# Patient Record
Sex: Female | Born: 1970
Health system: Southern US, Community
[De-identification: ages and names within clinical notes are randomized; demographics above are authoritative.]

## PROBLEM LIST (undated history)

## (undated) ENCOUNTER — Ambulatory Visit: Payer: BC Managed Care – PPO | Source: Home / Self Care

## (undated) DIAGNOSIS — T7840XA Allergy, unspecified, initial encounter: Secondary | ICD-10-CM

## (undated) DIAGNOSIS — R519 Headache, unspecified: Secondary | ICD-10-CM

## (undated) DIAGNOSIS — K7689 Other specified diseases of liver: Secondary | ICD-10-CM

## (undated) DIAGNOSIS — K219 Gastro-esophageal reflux disease without esophagitis: Secondary | ICD-10-CM

## (undated) DIAGNOSIS — R112 Nausea with vomiting, unspecified: Secondary | ICD-10-CM

## (undated) DIAGNOSIS — E063 Autoimmune thyroiditis: Secondary | ICD-10-CM

## (undated) DIAGNOSIS — Z8669 Personal history of other diseases of the nervous system and sense organs: Secondary | ICD-10-CM

## (undated) DIAGNOSIS — E079 Disorder of thyroid, unspecified: Secondary | ICD-10-CM

## (undated) DIAGNOSIS — I10 Essential (primary) hypertension: Secondary | ICD-10-CM

## (undated) DIAGNOSIS — E039 Hypothyroidism, unspecified: Secondary | ICD-10-CM

## (undated) DIAGNOSIS — Z5189 Encounter for other specified aftercare: Secondary | ICD-10-CM

## (undated) DIAGNOSIS — Z9889 Other specified postprocedural states: Secondary | ICD-10-CM

## (undated) DIAGNOSIS — F419 Anxiety disorder, unspecified: Secondary | ICD-10-CM

## (undated) HISTORY — PX: ABDOMINAL HYSTERECTOMY: SHX81

## (undated) HISTORY — DX: Disorder of thyroid, unspecified: E07.9

## (undated) HISTORY — DX: Essential (primary) hypertension: I10

## (undated) HISTORY — PX: DILATION AND CURETTAGE OF UTERUS: SHX78

## (undated) HISTORY — DX: Anxiety disorder, unspecified: F41.9

## (undated) HISTORY — DX: Gastro-esophageal reflux disease without esophagitis: K21.9

## (undated) HISTORY — DX: Allergy, unspecified, initial encounter: T78.40XA

## (undated) HISTORY — PX: COLONOSCOPY: SHX174

## (undated) HISTORY — DX: Personal history of other diseases of the nervous system and sense organs: Z86.69

## (undated) HISTORY — DX: Encounter for other specified aftercare: Z51.89

## (undated) HISTORY — PX: WISDOM TOOTH EXTRACTION: SHX21

---

## 2005-06-17 ENCOUNTER — Emergency Department (HOSPITAL_COMMUNITY): Admission: EM | Admit: 2005-06-17 | Discharge: 2005-06-18 | Payer: Self-pay | Admitting: Emergency Medicine

## 2005-06-18 ENCOUNTER — Ambulatory Visit (HOSPITAL_COMMUNITY): Admission: RE | Admit: 2005-06-18 | Discharge: 2005-06-18 | Payer: Self-pay | Admitting: *Deleted

## 2005-06-29 ENCOUNTER — Emergency Department (HOSPITAL_COMMUNITY): Admission: EM | Admit: 2005-06-29 | Discharge: 2005-06-29 | Payer: Self-pay | Admitting: Emergency Medicine

## 2007-07-28 ENCOUNTER — Other Ambulatory Visit: Admission: RE | Admit: 2007-07-28 | Discharge: 2007-07-28 | Payer: Self-pay | Admitting: Obstetrics & Gynecology

## 2007-07-31 ENCOUNTER — Encounter: Admission: RE | Admit: 2007-07-31 | Discharge: 2007-07-31 | Payer: Self-pay | Admitting: Obstetrics & Gynecology

## 2010-09-22 ENCOUNTER — Encounter: Payer: Self-pay | Admitting: Internal Medicine

## 2010-09-24 ENCOUNTER — Encounter: Payer: Self-pay | Admitting: Endocrinology

## 2012-11-30 ENCOUNTER — Other Ambulatory Visit: Payer: Self-pay

## 2012-11-30 DIAGNOSIS — Z1231 Encounter for screening mammogram for malignant neoplasm of breast: Secondary | ICD-10-CM

## 2012-12-17 ENCOUNTER — Ambulatory Visit
Admission: RE | Admit: 2012-12-17 | Discharge: 2012-12-17 | Disposition: A | Discharge status: Home / Self Care | Payer: PRIVATE HEALTH INSURANCE | Source: Ambulatory Visit

## 2012-12-17 DIAGNOSIS — Z1231 Encounter for screening mammogram for malignant neoplasm of breast: Secondary | ICD-10-CM

## 2012-12-18 ENCOUNTER — Other Ambulatory Visit: Payer: Self-pay | Admitting: Specialist

## 2012-12-18 DIAGNOSIS — N63 Unspecified lump in unspecified breast: Secondary | ICD-10-CM

## 2013-01-04 ENCOUNTER — Ambulatory Visit
Admission: RE | Admit: 2013-01-04 | Discharge: 2013-01-04 | Disposition: A | Discharge status: Home / Self Care | Payer: PRIVATE HEALTH INSURANCE | Source: Ambulatory Visit | Attending: Specialist | Admitting: Specialist

## 2013-01-04 DIAGNOSIS — N63 Unspecified lump in unspecified breast: Secondary | ICD-10-CM

## 2013-10-15 ENCOUNTER — Other Ambulatory Visit: Payer: Self-pay | Admitting: Endocrinology

## 2013-10-15 DIAGNOSIS — E049 Nontoxic goiter, unspecified: Secondary | ICD-10-CM

## 2013-11-16 ENCOUNTER — Other Ambulatory Visit: Payer: PRIVATE HEALTH INSURANCE

## 2015-06-24 ENCOUNTER — Other Ambulatory Visit: Payer: Self-pay | Admitting: Endocrinology

## 2015-06-24 DIAGNOSIS — E049 Nontoxic goiter, unspecified: Secondary | ICD-10-CM

## 2015-06-28 ENCOUNTER — Ambulatory Visit
Admission: RE | Admit: 2015-06-28 | Discharge: 2015-06-28 | Disposition: A | Discharge status: Home / Self Care | Payer: BLUE CROSS/BLUE SHIELD | Source: Ambulatory Visit | Attending: Endocrinology | Admitting: Endocrinology

## 2015-06-28 DIAGNOSIS — E049 Nontoxic goiter, unspecified: Secondary | ICD-10-CM

## 2015-10-05 ENCOUNTER — Other Ambulatory Visit (INDEPENDENT_AMBULATORY_CARE_PROVIDER_SITE_OTHER): Payer: BLUE CROSS/BLUE SHIELD

## 2015-10-05 ENCOUNTER — Encounter: Payer: Self-pay | Admitting: Internal Medicine

## 2015-10-05 ENCOUNTER — Ambulatory Visit (INDEPENDENT_AMBULATORY_CARE_PROVIDER_SITE_OTHER): Payer: BLUE CROSS/BLUE SHIELD | Admitting: Internal Medicine

## 2015-10-05 VITALS — BP 118/60 | HR 67 | Temp 98.8°F | Resp 12 | Ht 63.0 in | Wt 109.4 lb

## 2015-10-05 DIAGNOSIS — E039 Hypothyroidism, unspecified: Secondary | ICD-10-CM | POA: Diagnosis not present

## 2015-10-05 DIAGNOSIS — Z23 Encounter for immunization: Secondary | ICD-10-CM | POA: Diagnosis not present

## 2015-10-05 DIAGNOSIS — Z0000 Encounter for general adult medical examination without abnormal findings: Secondary | ICD-10-CM

## 2015-10-05 LAB — CBC
HCT: 38.9 % (ref 36.0–46.0)
Hemoglobin: 12.8 g/dL (ref 12.0–15.0)
MCHC: 33 g/dL (ref 30.0–36.0)
MCV: 88.2 fl (ref 78.0–100.0)
Platelets: 211 10*3/uL (ref 150.0–400.0)
RBC: 4.41 Mil/uL (ref 3.87–5.11)
RDW: 13.3 % (ref 11.5–15.5)
WBC: 6.1 10*3/uL (ref 4.0–10.5)

## 2015-10-05 LAB — COMPREHENSIVE METABOLIC PANEL
ALT: 17 U/L (ref 0–35)
AST: 18 U/L (ref 0–37)
Albumin: 4.4 g/dL (ref 3.5–5.2)
Alkaline Phosphatase: 60 U/L (ref 39–117)
BUN: 11 mg/dL (ref 6–23)
CO2: 27 mEq/L (ref 19–32)
Calcium: 9.3 mg/dL (ref 8.4–10.5)
Chloride: 103 mEq/L (ref 96–112)
Creatinine, Ser: 0.62 mg/dL (ref 0.40–1.20)
GFR: 111.73 mL/min (ref >60.00)
Glucose, Bld: 84 mg/dL (ref 70–99)
Potassium: 3.8 mEq/L (ref 3.5–5.1)
Sodium: 137 mEq/L (ref 135–145)
Total Bilirubin: 0.5 mg/dL (ref 0.2–1.2)
Total Protein: 7.5 g/dL (ref 6.0–8.3)

## 2015-10-05 LAB — LIPID PANEL
Cholesterol: 153 mg/dL (ref 0–200)
HDL: 48 mg/dL (ref >39.00)
LDL Cholesterol: 89 mg/dL (ref 0–99)
NonHDL: 104.97
Total CHOL/HDL Ratio: 3
Triglycerides: 79 mg/dL (ref 0.0–149.0)
VLDL: 15.8 mg/dL (ref 0.0–40.0)

## 2015-10-05 NOTE — Progress Notes (Signed)
Pre visit review using our clinic review tool, if applicable. No additional management support is needed unless otherwise documented below in the visit note. 

## 2015-10-05 NOTE — Patient Instructions (Signed)
We are checking the blood work today and will call you back with the results.   We have given you the flu shot and the tetanus shot today.   Health Maintenance, Female Adopting a healthy lifestyle and getting preventive care can go a long way to promote health and wellness. Talk with your health care provider about what schedule of regular examinations is right for you. This is a good chance for you to check in with your provider about disease prevention and staying healthy. In between checkups, there are plenty of things you can do on your own. Experts have done a lot of research about which lifestyle changes and preventive measures are most likely to keep you healthy. Ask your health care provider for more information. WEIGHT AND DIET  Eat a healthy diet  Be sure to include plenty of vegetables, fruits, low-fat dairy products, and lean protein.  Do not eat a lot of foods high in solid fats, added sugars, or salt.  Get regular exercise. This is one of the most important things you can do for your health.  Most adults should exercise for at least 150 minutes each week. The exercise should increase your heart rate and make you sweat (moderate-intensity exercise).  Most adults should also do strengthening exercises at least twice a week. This is in addition to the moderate-intensity exercise.  Maintain a healthy weight  Body mass index (BMI) is a measurement that can be used to identify possible weight problems. It estimates body fat based on height and weight. Your health care provider can help determine your BMI and help you achieve or maintain a healthy weight.  For females 67 years of age and older:   A BMI below 18.5 is considered underweight.  A BMI of 18.5 to 24.9 is normal.  A BMI of 25 to 29.9 is considered overweight.  A BMI of 30 and above is considered obese.  Watch levels of cholesterol and blood lipids  You should start having your blood tested for lipids and  cholesterol at 43 years of age, then have this test every 5 years.  You may need to have your cholesterol levels checked more often if:  Your lipid or cholesterol levels are high.  You are older than 43 years of age.  You are at high risk for heart disease.  CANCER SCREENING   Lung Cancer  Lung cancer screening is recommended for adults 47-41 years old who are at high risk for lung cancer because of a history of smoking.  A yearly low-dose CT scan of the lungs is recommended for people who:  Currently smoke.  Have quit within the past 15 years.  Have at least a 30-pack-year history of smoking. A pack year is smoking an average of one pack of cigarettes a day for 1 year.  Yearly screening should continue until it has been 15 years since you quit.  Yearly screening should stop if you develop a health problem that would prevent you from having lung cancer treatment.  Breast Cancer  Practice breast self-awareness. This means understanding how your breasts normally appear and feel.  It also means doing regular breast self-exams. Let your health care provider know about any changes, no matter how small.  If you are in your 20s or 30s, you should have a clinical breast exam (CBE) by a health care provider every 1-3 years as part of a regular health exam.  If you are 31 or older, have a CBE every year.  Also consider having a breast X-ray (mammogram) every year.  If you have a family history of breast cancer, talk to your health care provider about genetic screening.  If you are at high risk for breast cancer, talk to your health care provider about having an MRI and a mammogram every year.  Breast cancer gene (BRCA) assessment is recommended for women who have family members with BRCA-related cancers. BRCA-related cancers include:  Breast.  Ovarian.  Tubal.  Peritoneal cancers.  Results of the assessment will determine the need for genetic counseling and BRCA1 and BRCA2  testing. Cervical Cancer Your health care provider may recommend that you be screened regularly for cancer of the pelvic organs (ovaries, uterus, and vagina). This screening involves a pelvic examination, including checking for microscopic changes to the surface of your cervix (Pap test). You may be encouraged to have this screening done every 3 years, beginning at age 21.  For women ages 30-65, health care providers may recommend pelvic exams and Pap testing every 3 years, or they may recommend the Pap and pelvic exam, combined with testing for human papilloma virus (HPV), every 5 years. Some types of HPV increase your risk of cervical cancer. Testing for HPV may also be done on women of any age with unclear Pap test results.  Other health care providers may not recommend any screening for nonpregnant women who are considered low risk for pelvic cancer and who do not have symptoms. Ask your health care provider if a screening pelvic exam is right for you.  If you have had past treatment for cervical cancer or a condition that could lead to cancer, you need Pap tests and screening for cancer for at least 20 years after your treatment. If Pap tests have been discontinued, your risk factors (such as having a new sexual partner) need to be reassessed to determine if screening should resume. Some women have medical problems that increase the chance of getting cervical cancer. In these cases, your health care provider may recommend more frequent screening and Pap tests. Colorectal Cancer  This type of cancer can be detected and often prevented.  Routine colorectal cancer screening usually begins at 43 years of age and continues through 43 years of age.  Your health care provider may recommend screening at an earlier age if you have risk factors for colon cancer.  Your health care provider may also recommend using home test kits to check for hidden blood in the stool.  A small camera at the end of a  tube can be used to examine your colon directly (sigmoidoscopy or colonoscopy). This is done to check for the earliest forms of colorectal cancer.  Routine screening usually begins at age 50.  Direct examination of the colon should be repeated every 5-10 years through 43 years of age. However, you may need to be screened more often if early forms of precancerous polyps or small growths are found. Skin Cancer  Check your skin from head to toe regularly.  Tell your health care provider about any new moles or changes in moles, especially if there is a change in a mole's shape or color.  Also tell your health care provider if you have a mole that is larger than the size of a pencil eraser.  Always use sunscreen. Apply sunscreen liberally and repeatedly throughout the day.  Protect yourself by wearing long sleeves, pants, a wide-brimmed hat, and sunglasses whenever you are outside. HEART DISEASE, DIABETES, AND HIGH BLOOD PRESSURE     High blood pressure causes heart disease and increases the risk of stroke. High blood pressure is more likely to develop in:  People who have blood pressure in the high end of the normal range (130-139/85-89 mm Hg).  People who are overweight or obese.  People who are African American.  If you are 18-39 years of age, have your blood pressure checked every 3-5 years. If you are 40 years of age or older, have your blood pressure checked every year. You should have your blood pressure measured twice--once when you are at a hospital or clinic, and once when you are not at a hospital or clinic. Record the average of the two measurements. To check your blood pressure when you are not at a hospital or clinic, you can use:  An automated blood pressure machine at a pharmacy.  A home blood pressure monitor.  If you are between 55 years and 79 years old, ask your health care provider if you should take aspirin to prevent strokes.  Have regular diabetes screenings. This  involves taking a blood sample to check your fasting blood sugar level.  If you are at a normal weight and have a low risk for diabetes, have this test once every three years after 43 years of age.  If you are overweight and have a high risk for diabetes, consider being tested at a younger age or more often. PREVENTING INFECTION  Hepatitis B  If you have a higher risk for hepatitis B, you should be screened for this virus. You are considered at high risk for hepatitis B if:  You were born in a country where hepatitis B is common. Ask your health care provider which countries are considered high risk.  Your parents were born in a high-risk country, and you have not been immunized against hepatitis B (hepatitis B vaccine).  You have HIV or AIDS.  You use needles to inject street drugs.  You live with someone who has hepatitis B.  You have had sex with someone who has hepatitis B.  You get hemodialysis treatment.  You take certain medicines for conditions, including cancer, organ transplantation, and autoimmune conditions. Hepatitis C  Blood testing is recommended for:  Everyone born from 1945 through 1965.  Anyone with known risk factors for hepatitis C. Sexually transmitted infections (STIs)  You should be screened for sexually transmitted infections (STIs) including gonorrhea and chlamydia if:  You are sexually active and are younger than 43 years of age.  You are older than 43 years of age and your health care provider tells you that you are at risk for this type of infection.  Your sexual activity has changed since you were last screened and you are at an increased risk for chlamydia or gonorrhea. Ask your health care provider if you are at risk.  If you do not have HIV, but are at risk, it may be recommended that you take a prescription medicine daily to prevent HIV infection. This is called pre-exposure prophylaxis (PrEP). You are considered at risk if:  You are  sexually active and do not regularly use condoms or know the HIV status of your partner(s).  You take drugs by injection.  You are sexually active with a partner who has HIV. Talk with your health care provider about whether you are at high risk of being infected with HIV. If you choose to begin PrEP, you should first be tested for HIV. You should then be tested every 3 months for as   long as you are taking PrEP.  PREGNANCY   If you are premenopausal and you may become pregnant, ask your health care provider about preconception counseling.  If you may become pregnant, take 400 to 800 micrograms (mcg) of folic acid every day.  If you want to prevent pregnancy, talk to your health care provider about birth control (contraception). OSTEOPOROSIS AND MENOPAUSE   Osteoporosis is a disease in which the bones lose minerals and strength with aging. This can result in serious bone fractures. Your risk for osteoporosis can be identified using a bone density scan.  If you are 27 years of age or older, or if you are at risk for osteoporosis and fractures, ask your health care provider if you should be screened.  Ask your health care provider whether you should take a calcium or vitamin D supplement to lower your risk for osteoporosis.  Menopause may have certain physical symptoms and risks.  Hormone replacement therapy may reduce some of these symptoms and risks. Talk to your health care provider about whether hormone replacement therapy is right for you.  HOME CARE INSTRUCTIONS   Schedule regular health, dental, and eye exams.  Stay current with your immunizations.   Do not use any tobacco products including cigarettes, chewing tobacco, or electronic cigarettes.  If you are pregnant, do not drink alcohol.  If you are breastfeeding, limit how much and how often you drink alcohol.  Limit alcohol intake to no more than 1 drink per day for nonpregnant women. One drink equals 12 ounces of beer, 5  ounces of wine, or 1 ounces of hard liquor.  Do not use street drugs.  Do not share needles.  Ask your health care provider for help if you need support or information about quitting drugs.  Tell your health care provider if you often feel depressed.  Tell your health care provider if you have ever been abused or do not feel safe at home.   This information is not intended to replace advice given to you by your health care provider. Make sure you discuss any questions you have with your health care provider.   Document Released: 03/04/2011 Document Revised: 09/09/2014 Document Reviewed: 07/21/2013 Elsevier Interactive Patient Education [employer].

## 2015-10-06 DIAGNOSIS — Z0000 Encounter for general adult medical examination without abnormal findings: Secondary | ICD-10-CM | POA: Insufficient documentation

## 2015-10-06 DIAGNOSIS — E039 Hypothyroidism, unspecified: Secondary | ICD-10-CM | POA: Insufficient documentation

## 2015-10-06 NOTE — Assessment & Plan Note (Signed)
Given flu and tetanus and she declines HIV screening today. Non-smoker and advised on exercise. Counseled on sun protection and mole surveillance. Given screening guidelines.

## 2015-10-06 NOTE — Progress Notes (Signed)
   Subjective:    Patient ID: Dana Pace, female    DOB: 09/10/1970, 45 y.o.   MRN: [id number]  HPI The patient is a 45 YO female coming in for wellness as a new patient. No concerns or complaints. Takes thyroid medicine which she is currently getting from endo and levels recently checked and normal.   PMH, [facility], social history reviewed and updated.   Review of Systems  Constitutional: Negative for fever, activity change, appetite change, fatigue and unexpected weight change.  HENT: Negative.   Eyes: Negative.   Respiratory: Negative for cough, chest tightness, shortness of breath and wheezing.   Cardiovascular: Negative for chest pain, palpitations and leg swelling.  Gastrointestinal: Negative for nausea, abdominal pain, diarrhea, constipation and abdominal distention.  Musculoskeletal: Negative.   Skin: Negative.   Neurological: Negative.   Psychiatric/Behavioral: Negative.       Objective:   Physical Exam  Constitutional: She is oriented to person, place, and time. She appears well-developed and well-nourished.  HENT:  Head: Normocephalic and atraumatic.  Eyes: EOM are normal.  Neck: Normal range of motion.  Cardiovascular: Normal rate and regular rhythm.   Pulmonary/Chest: Effort normal and breath sounds normal. No respiratory distress. She has no wheezes.  Abdominal: Soft. Bowel sounds are normal. She exhibits no distension. There is no tenderness. There is no rebound.  Musculoskeletal: She exhibits no edema.  Neurological: She is alert and oriented to person, place, and time. Coordination normal.  Skin: Skin is warm and dry.  Psychiatric: She has a normal mood and affect.   Filed Vitals:   10/05/15 1039  BP: 118/60  Pulse: 67  Temp: 98.8 F (37.1 C)  TempSrc: Oral  Resp: 12  Height: 5\' 3"  (1.6 m)  Weight: 109 lb 6.4 oz (49.624 kg)  SpO2: 98%      Assessment & Plan:  Tdap and flu shot given at visit.

## 2015-10-06 NOTE — Assessment & Plan Note (Signed)
Currently taking 125 mcg daily synthroid and levels recently checked.

## 2016-09-09 DIAGNOSIS — E039 Hypothyroidism, unspecified: Secondary | ICD-10-CM | POA: Diagnosis not present

## 2016-09-11 DIAGNOSIS — E039 Hypothyroidism, unspecified: Secondary | ICD-10-CM | POA: Diagnosis not present

## 2016-09-11 DIAGNOSIS — E049 Nontoxic goiter, unspecified: Secondary | ICD-10-CM | POA: Diagnosis not present

## 2016-12-30 ENCOUNTER — Encounter: Payer: BLUE CROSS/BLUE SHIELD | Admitting: Internal Medicine

## 2017-01-03 ENCOUNTER — Ambulatory Visit (INDEPENDENT_AMBULATORY_CARE_PROVIDER_SITE_OTHER): Payer: BLUE CROSS/BLUE SHIELD | Admitting: Internal Medicine

## 2017-01-03 ENCOUNTER — Encounter: Payer: Self-pay | Admitting: Internal Medicine

## 2017-01-03 ENCOUNTER — Other Ambulatory Visit (INDEPENDENT_AMBULATORY_CARE_PROVIDER_SITE_OTHER): Payer: BLUE CROSS/BLUE SHIELD

## 2017-01-03 VITALS — BP 130/74 | HR 66 | Temp 98.8°F | Resp 12 | Ht 63.0 in | Wt 108.5 lb

## 2017-01-03 DIAGNOSIS — Z0000 Encounter for general adult medical examination without abnormal findings: Secondary | ICD-10-CM

## 2017-01-03 DIAGNOSIS — E039 Hypothyroidism, unspecified: Secondary | ICD-10-CM | POA: Diagnosis not present

## 2017-01-03 LAB — CBC
HCT: 37 % (ref 36.0–46.0)
Hemoglobin: 12.4 g/dL (ref 12.0–15.0)
MCHC: 33.6 g/dL (ref 30.0–36.0)
MCV: 89 fl (ref 78.0–100.0)
Platelets: 212 10*3/uL (ref 150.0–400.0)
RBC: 4.16 Mil/uL (ref 3.87–5.11)
RDW: 13.2 % (ref 11.5–15.5)
WBC: 6.7 10*3/uL (ref 4.0–10.5)

## 2017-01-03 LAB — COMPREHENSIVE METABOLIC PANEL
ALT: 12 U/L (ref 0–35)
AST: 15 U/L (ref 0–37)
Albumin: 4.4 g/dL (ref 3.5–5.2)
Alkaline Phosphatase: 57 U/L (ref 39–117)
BUN: 10 mg/dL (ref 6–23)
CO2: 27 mEq/L (ref 19–32)
Calcium: 9 mg/dL (ref 8.4–10.5)
Chloride: 109 mEq/L (ref 96–112)
Creatinine, Ser: 0.69 mg/dL (ref 0.40–1.20)
GFR: 98.18 mL/min (ref >60.00)
Glucose, Bld: 90 mg/dL (ref 70–99)
Potassium: 3.5 mEq/L (ref 3.5–5.1)
Sodium: 143 mEq/L (ref 135–145)
Total Bilirubin: 0.5 mg/dL (ref 0.2–1.2)
Total Protein: 6.9 g/dL (ref 6.0–8.3)

## 2017-01-03 LAB — LIPID PANEL
Cholesterol: 151 mg/dL (ref 0–200)
HDL: 48.6 mg/dL (ref >39.00)
LDL Cholesterol: 87 mg/dL (ref 0–99)
NonHDL: 102.73
Total CHOL/HDL Ratio: 3
Triglycerides: 79 mg/dL (ref 0.0–149.0)
VLDL: 15.8 mg/dL (ref 0.0–40.0)

## 2017-01-03 LAB — T4, FREE: Free T4: 1.11 ng/dL (ref 0.60–1.60)

## 2017-01-03 LAB — TSH: TSH: 8.07 u[IU]/mL — ABNORMAL HIGH (ref 0.35–4.50)

## 2017-01-03 LAB — VITAMIN D 25 HYDROXY (VIT D DEFICIENCY, FRACTURES): VITD: 26.56 ng/mL — ABNORMAL LOW (ref 30.00–100.00)

## 2017-01-03 MED ORDER — TERBINAFINE HCL 250 MG PO TABS: 250.0000 mg | ORAL_TABLET | Freq: Every day | ORAL | 0 refills | Status: DC

## 2017-01-03 NOTE — Progress Notes (Signed)
Pre visit review using our clinic review tool, if applicable. No additional management support is needed unless otherwise documented below in the visit note. 

## 2017-01-03 NOTE — Patient Instructions (Signed)
We have sent in the lamisil to try for the fungus. Take 1 pill daily for the one week then skip 3 weeks, then take for 1 week, then skip 3 weeks, then take for 1 week then stop. This has about a 30% chance of clearing the fungus.   Health Maintenance, Female Adopting a healthy lifestyle and getting preventive care can go a long way to promote health and wellness. Talk with your health care provider about what schedule of regular examinations is right for you. This is a good chance for you to check in with your provider about disease prevention and staying healthy. In between checkups, there are plenty of things you can do on your own. Experts have done a lot of research about which lifestyle changes and preventive measures are most likely to keep you healthy. Ask your health care provider for more information. Weight and diet Eat a healthy diet  Be sure to include plenty of vegetables, fruits, low-fat dairy products, and lean protein.  Do not eat a lot of foods high in solid fats, added sugars, or salt.  Get regular exercise. This is one of the most important things you can do for your health.  Most adults should exercise for at least 150 minutes each week. The exercise should increase your heart rate and make you sweat (moderate-intensity exercise).  Most adults should also do strengthening exercises at least twice a week. This is in addition to the moderate-intensity exercise. Maintain a healthy weight  Body mass index (BMI) is a measurement that can be used to identify possible weight problems. It estimates body fat based on height and weight. Your health care provider can help determine your BMI and help you achieve or maintain a healthy weight.  For females 78 years of age and older:  A BMI below 18.5 is considered underweight.  A BMI of 18.5 to 24.9 is normal.  A BMI of 25 to 29.9 is considered overweight.  A BMI of 30 and above is considered obese. Watch levels of cholesterol and  blood lipids  You should start having your blood tested for lipids and cholesterol at 44 years of age, then have this test every 5 years.  You may need to have your cholesterol levels checked more often if:  Your lipid or cholesterol levels are high.  You are older than 44 years of age.  You are at high risk for heart disease. Cancer screening Lung Cancer  Lung cancer screening is recommended for adults 2-25 years old who are at high risk for lung cancer because of a history of smoking.  A yearly low-dose CT scan of the lungs is recommended for people who:  Currently smoke.  Have quit within the past 15 years.  Have at least a 30-pack-year history of smoking. A pack year is smoking an average of one pack of cigarettes a day for 1 year.  Yearly screening should continue until it has been 15 years since you quit.  Yearly screening should stop if you develop a health problem that would prevent you from having lung cancer treatment. Breast Cancer  Practice breast self-awareness. This means understanding how your breasts normally appear and feel.  It also means doing regular breast self-exams. Let your health care provider know about any changes, no matter how small.  If you are in your 20s or 30s, you should have a clinical breast exam (CBE) by a health care provider every 1-3 years as part of a regular health exam.  If you are 40 or older, have a CBE every year. Also consider having a breast X-ray (mammogram) every year.  If you have a family history of breast cancer, talk to your health care provider about genetic screening.  If you are at high risk for breast cancer, talk to your health care provider about having an MRI and a mammogram every year.  Breast cancer gene (BRCA) assessment is recommended for women who have family members with BRCA-related cancers. BRCA-related cancers include:  Breast.  Ovarian.  Tubal.  Peritoneal cancers.  Results of the assessment  will determine the need for genetic counseling and BRCA1 and BRCA2 testing. Cervical Cancer  Your health care provider may recommend that you be screened regularly for cancer of the pelvic organs (ovaries, uterus, and vagina). This screening involves a pelvic examination, including checking for microscopic changes to the surface of your cervix (Pap test). You may be encouraged to have this screening done every 3 years, beginning at age 65.  For women ages 21-65, health care providers may recommend pelvic exams and Pap testing every 3 years, or they may recommend the Pap and pelvic exam, combined with testing for human papilloma virus (HPV), every 5 years. Some types of HPV increase your risk of cervical cancer. Testing for HPV may also be done on women of any age with unclear Pap test results.  Other health care providers may not recommend any screening for nonpregnant women who are considered low risk for pelvic cancer and who do not have symptoms. Ask your health care provider if a screening pelvic exam is right for you.  If you have had past treatment for cervical cancer or a condition that could lead to cancer, you need Pap tests and screening for cancer for at least 20 years after your treatment. If Pap tests have been discontinued, your risk factors (such as having a new sexual partner) need to be reassessed to determine if screening should resume. Some women have medical problems that increase the chance of getting cervical cancer. In these cases, your health care provider may recommend more frequent screening and Pap tests. Colorectal Cancer  This type of cancer can be detected and often prevented.  Routine colorectal cancer screening usually begins at 45 years of age and continues through 44 years of age.  Your health care provider may recommend screening at an earlier age if you have risk factors for colon cancer.  Your health care provider may also recommend using home test kits to check  for hidden blood in the stool.  A small camera at the end of a tube can be used to examine your colon directly (sigmoidoscopy or colonoscopy). This is done to check for the earliest forms of colorectal cancer.  Routine screening usually begins at age 28.  Direct examination of the colon should be repeated every 5-10 years through 44 years of age. However, you may need to be screened more often if early forms of precancerous polyps or small growths are found. Skin Cancer  Check your skin from head to toe regularly.  Tell your health care provider about any new moles or changes in moles, especially if there is a change in a mole's shape or color.  Also tell your health care provider if you have a mole that is larger than the size of a pencil eraser.  Always use sunscreen. Apply sunscreen liberally and repeatedly throughout the day.  Protect yourself by wearing long sleeves, pants, a wide-brimmed hat, and sunglasses whenever  you are outside. Heart disease, diabetes, and high blood pressure  High blood pressure causes heart disease and increases the risk of stroke. High blood pressure is more likely to develop in:  People who have blood pressure in the high end of the normal range (130-139/85-89 mm Hg).  People who are overweight or obese.  People who are African American.  If you are 65-49 years of age, have your blood pressure checked every 3-5 years. If you are 70 years of age or older, have your blood pressure checked every year. You should have your blood pressure measured twice-once when you are at a hospital or clinic, and once when you are not at a hospital or clinic. Record the average of the two measurements. To check your blood pressure when you are not at a hospital or clinic, you can use:  An automated blood pressure machine at a pharmacy.  A home blood pressure monitor.  If you are between 17 years and 76 years old, ask your health care provider if you should take aspirin  to prevent strokes.  Have regular diabetes screenings. This involves taking a blood sample to check your fasting blood sugar level.  If you are at a normal weight and have a low risk for diabetes, have this test once every three years after 44 years of age.  If you are overweight and have a high risk for diabetes, consider being tested at a younger age or more often. Preventing infection Hepatitis B  If you have a higher risk for hepatitis B, you should be screened for this virus. You are considered at high risk for hepatitis B if:  You were born in a country where hepatitis B is common. Ask your health care provider which countries are considered high risk.  Your parents were born in a high-risk country, and you have not been immunized against hepatitis B (hepatitis B vaccine).  You have HIV or AIDS.  You use needles to inject street drugs.  You live with someone who has hepatitis B.  You have had sex with someone who has hepatitis B.  You get hemodialysis treatment.  You take certain medicines for conditions, including cancer, organ transplantation, and autoimmune conditions. Hepatitis C  Blood testing is recommended for:  Everyone born from [birth year] through 1963.  Anyone with known risk factors for hepatitis C. Sexually transmitted infections (STIs)  You should be screened for sexually transmitted infections (STIs) including gonorrhea and chlamydia if:  You are sexually active and are younger than 44 years of age.  You are older than 44 years of age and your health care provider tells you that you are at risk for this type of infection.  Your sexual activity has changed since you were last screened and you are at an increased risk for chlamydia or gonorrhea. Ask your health care provider if you are at risk.  If you do not have HIV, but are at risk, it may be recommended that you take a prescription medicine daily to prevent HIV infection. This is called pre-exposure  prophylaxis (PrEP). You are considered at risk if:  You are sexually active and do not regularly use condoms or know the HIV status of your partner(s).  You take drugs by injection.  You are sexually active with a partner who has HIV. Talk with your health care provider about whether you are at high risk of being infected with HIV. If you choose to begin PrEP, you should first be tested for HIV.  You should then be tested every 3 months for as long as you are taking PrEP. Pregnancy  If you are premenopausal and you may become pregnant, ask your health care provider about preconception counseling.  If you may become pregnant, take 400 to 800 micrograms (mcg) of folic acid every day.  If you want to prevent pregnancy, talk to your health care provider about birth control (contraception). Osteoporosis and menopause  Osteoporosis is a disease in which the bones lose minerals and strength with aging. This can result in serious bone fractures. Your risk for osteoporosis can be identified using a bone density scan.  If you are 78 years of age or older, or if you are at risk for osteoporosis and fractures, ask your health care provider if you should be screened.  Ask your health care provider whether you should take a calcium or vitamin D supplement to lower your risk for osteoporosis.  Menopause may have certain physical symptoms and risks.  Hormone replacement therapy may reduce some of these symptoms and risks. Talk to your health care provider about whether hormone replacement therapy is right for you. Follow these instructions at home:  Schedule regular health, dental, and eye exams.  Stay current with your immunizations.  Do not use any tobacco products including cigarettes, chewing tobacco, or electronic cigarettes.  If you are pregnant, do not drink alcohol.  If you are breastfeeding, limit how much and how often you drink alcohol.  Limit alcohol intake to no more than 1 drink  per day for nonpregnant women. One drink equals 12 ounces of beer, 5 ounces of wine, or 1 ounces of hard liquor.  Do not use street drugs.  Do not share needles.  Ask your health care provider for help if you need support or information about quitting drugs.  Tell your health care provider if you often feel depressed.  Tell your health care provider if you have ever been abused or do not feel safe at home. This information is not intended to replace advice given to you by your health care provider. Make sure you discuss any questions you have with your health care provider. Document Released: 03/04/2011 Document Revised: 01/25/2016 Document Reviewed: 05/23/2015 Elsevier Interactive Patient Education  2017 [employer].

## 2017-01-03 NOTE — Assessment & Plan Note (Addendum)
HIV screening at her work, flu and tetanus up to date. Counseled on sun safety and dangers of distracted driving. Colonoscopy due at 55. Given screening recommendations.

## 2017-01-03 NOTE — Progress Notes (Signed)
   Subjective:    Patient ID: Dana Pace, female    DOB: 12/20/1970, 46 y.o.   MRN: [id number]  HPI The patient is a 46 YO female coming in for wellness.   PMH, [facility], social history reviewed and updated.   Review of Systems  Constitutional: Negative.   HENT: Negative.   Eyes: Negative.   Respiratory: Negative for cough, chest tightness and shortness of breath.   Cardiovascular: Negative for chest pain, palpitations and leg swelling.  Gastrointestinal: Negative for abdominal distention, abdominal pain, constipation, diarrhea, nausea and vomiting.  Musculoskeletal: Negative.   Skin: Negative.   Neurological: Negative.   Psychiatric/Behavioral: Negative.       Objective:   Physical Exam  Constitutional: She is oriented to person, place, and time. She appears well-developed and well-nourished.  HENT:  Head: Normocephalic and atraumatic.  Eyes: EOM are normal.  Neck: Normal range of motion.  Cardiovascular: Normal rate and regular rhythm.   Pulmonary/Chest: Effort normal and breath sounds normal. No respiratory distress. She has no wheezes. She has no rales.  Abdominal: Soft. Bowel sounds are normal. She exhibits no distension. There is no tenderness. There is no rebound.  Musculoskeletal: She exhibits no edema.  Neurological: She is alert and oriented to person, place, and time. Coordination normal.  Skin: Skin is warm and dry.  Psychiatric: She has a normal mood and affect.   Vitals:   01/03/17 1257  BP: 130/74  Pulse: 66  Resp: 12  Temp: 98.8 F (37.1 C)  TempSrc: Oral  SpO2: 100%  Weight: 108 lb 8 oz (49.2 kg)  Height: 5\' 3"  (1.6 m)      Assessment & Plan:

## 2017-01-05 NOTE — Assessment & Plan Note (Signed)
Taking 100 mcg synthroid daily, checking TSH and free T4 and adjust as needed. Seeing endo but has returned to them recently.

## 2017-01-07 ENCOUNTER — Encounter: Payer: Self-pay | Admitting: Internal Medicine

## 2017-04-01 ENCOUNTER — Encounter: Payer: Self-pay | Admitting: Nurse Practitioner

## 2017-04-01 ENCOUNTER — Telehealth: Payer: Self-pay | Admitting: Internal Medicine

## 2017-04-01 ENCOUNTER — Telehealth: Payer: Self-pay | Admitting: Nurse Practitioner

## 2017-04-01 ENCOUNTER — Emergency Department (HOSPITAL_COMMUNITY)
Admission: EM | Admit: 2017-04-01 | Discharge: 2017-04-01 | Disposition: A | Discharge status: Home / Self Care | Payer: BLUE CROSS/BLUE SHIELD | Attending: Emergency Medicine | Admitting: Emergency Medicine

## 2017-04-01 ENCOUNTER — Encounter (HOSPITAL_COMMUNITY): Payer: Self-pay | Admitting: Emergency Medicine

## 2017-04-01 ENCOUNTER — Ambulatory Visit (INDEPENDENT_AMBULATORY_CARE_PROVIDER_SITE_OTHER): Payer: BLUE CROSS/BLUE SHIELD | Admitting: Nurse Practitioner

## 2017-04-01 DIAGNOSIS — G43009 Migraine without aura, not intractable, without status migrainosus: Secondary | ICD-10-CM | POA: Diagnosis not present

## 2017-04-01 DIAGNOSIS — Z5321 Procedure and treatment not carried out due to patient leaving prior to being seen by health care provider: Secondary | ICD-10-CM | POA: Diagnosis not present

## 2017-04-01 DIAGNOSIS — R51 Headache: Secondary | ICD-10-CM | POA: Insufficient documentation

## 2017-04-01 MED ORDER — CYCLOBENZAPRINE HCL 5 MG PO TABS: 5.0000 mg | ORAL_TABLET | Freq: Every day | ORAL | 0 refills | Status: DC

## 2017-04-01 MED ORDER — ASPIRIN-ACETAMINOPHEN-CAFFEINE 250-250-65 MG PO TABS: 1.0000 | ORAL_TABLET | Freq: Three times a day (TID) | ORAL | 0 refills | Status: DC | PRN

## 2017-04-01 MED ORDER — SUMATRIPTAN SUCCINATE 50 MG PO TABS: 50.0000 mg | ORAL_TABLET | ORAL | 0 refills | Status: DC | PRN

## 2017-04-01 MED ORDER — ACETAMINOPHEN 500 MG PO TABS: 500.0000 mg | ORAL_TABLET | Freq: Four times a day (QID) | ORAL | 0 refills | Status: DC | PRN

## 2017-04-01 NOTE — ED Notes (Signed)
Pt reports her doctors office has called her back and told her that she was okay and did not need to be seen in the ED. Pt states she feels better. Pt encouraged to stay and told to come back to ED for new, worsening or concerning symptoms. Pt verbalized understanding and this RN visualized pt ambulating out of ED with independent gait.

## 2017-04-01 NOTE — Telephone Encounter (Signed)
Patient states she took imitrex at lunch time and now she feels likes she has a buzz as if she had been drinking.  Patient is requesting a different medication in place of the imitrex.  States employer is making her go home.  Did send patient for triage by team health.  Patient states she was also was triaged by nurse at work and was advised to call our office.

## 2017-04-01 NOTE — Telephone Encounter (Signed)
Stop imitrex Excedrin migraine sent

## 2017-04-01 NOTE — Telephone Encounter (Signed)
Patient Name: Dana Pace  DOB: [birth year]    Initial Comment Caller states, she is having to a reaction to her rx. - she feels intoxicated, no dizziness, slight confusion.    Nurse Assessment  Nurse: Thad Ranger RN, Langley Gauss Date/Time (Eastern Time): 04/01/2017 1:52:02 PM  Confirm and document reason for call. If symptomatic, describe symptoms. ---Caller states, she is having to a reaction to her rx. - she feels intoxicated, no dizziness, slight confusion. Took med for a HA and she has never taken it before.  Does the patient have any new or worsening symptoms? ---Yes  Will a triage be completed? ---Yes  Related visit to physician within the last 2 weeks? ---Yes  Does the PT have any chronic conditions? (i.e. diabetes, asthma, etc.) ---Yes  List chronic conditions. ---Migraine HA, Thyroid dx  Is the patient pregnant or possibly pregnant? (Ask all females between the ages of 23-55) ---No  Is this a behavioral health or substance abuse call? ---No     Guidelines    Guideline Title Affirmed Question Affirmed Notes  Confusion - Delirium Headache or vomiting    Final Disposition User   Go to ED [facility] [place], RN, Langley Gauss    Referrals  GO TO FACILITY UNDECIDED   Disagree/Comply: Comply

## 2017-04-01 NOTE — ED Triage Notes (Signed)
Pt started on Imitrex today and 15 minutes after taking the patient says she felt "high". Pt took med at 1230. NAD at this time. Pt says she still has a headache. Pt also took thyroid medicine today. Pt says she feels a little better. Pt has eaten lunch, denies vomiting. No vision changes, no breathing concerns.

## 2017-04-01 NOTE — Patient Instructions (Addendum)
Call office if no improvement in 2days.  May need head CT if no improvement with imitrex, tylenol and flexeril.   Migraine Headache A migraine headache is a very strong throbbing pain on one side or both sides of your head. Migraines can also cause other symptoms. Talk with your doctor about what things may bring on (trigger) your migraine headaches. Follow these instructions at home: Medicines  Take over-the-counter and prescription medicines only as told by your doctor.  Do not drive or use heavy machinery while taking prescription pain medicine.  To prevent or treat constipation while you are taking prescription pain medicine, your doctor may recommend that you: ? Drink enough fluid to keep your pee (urine) clear or pale yellow. ? Take over-the-counter or prescription medicines. ? Eat foods that are high in fiber. These include fresh fruits and vegetables, whole grains, and beans. ? Limit foods that are high in fat and processed sugars. These include fried and sweet foods. Lifestyle  Avoid alcohol.  Do not use any products that contain nicotine or tobacco, such as cigarettes and e-cigarettes. If you need help quitting, ask your doctor.  Get at least 8 hours of sleep every night.  Limit your stress. General instructions   Keep a journal to find out what may bring on your migraines. For example, write down: ? What you eat and drink. ? How much sleep you get. ? Any change in what you eat or drink. ? Any change in your medicines.  If you have a migraine: ? Avoid things that make your symptoms worse, such as bright lights. ? It may help to lie down in a dark, quiet room. ? Do not drive or use heavy machinery. ? Ask your doctor what activities are safe for you.  Keep all follow-up visits as told by your doctor. This is important. Contact a doctor if:  You get a migraine that is different or worse than your usual migraines. Get help right away if:  Your migraine gets very  bad.  You have a fever.  You have a stiff neck.  You have trouble seeing.  Your muscles feel weak or like you cannot control them.  You start to lose your balance a lot.  You start to have trouble walking.  You pass out (faint). This information is not intended to replace advice given to you by your health care provider. Make sure you discuss any questions you have with your health care provider. Document Released: 05/28/2008 Document Revised: 03/08/2016 Document Reviewed: 02/05/2016 Elsevier Interactive Patient Education  2017 [employer].

## 2017-04-01 NOTE — ED Notes (Signed)
Patient states she feels hot and nauseated.  Rechecked vitals, reassured patient we are trying to get all patients back to rooms to be seen by providers as quickly as possible.  Patient NAD at this time.  Doylene Bode RN (Nurse 1st) notified.

## 2017-04-01 NOTE — Progress Notes (Addendum)
Subjective:  Patient ID: Dana Pace, female    DOB: 23-Dec-1970  Age: 46 y.o. MRN: [id number]  CC: Headache (headache 2 and half week---left side neck pain--)    Headache   This is a recurrent problem. The current episode started 1 to 4 weeks ago. The problem occurs constantly. The problem has been waxing and waning. The pain is located in the left unilateral, retro-orbital and occipital region. The pain radiates to the left neck. The pain quality is similar to prior headaches. The quality of the pain is described as aching and dull. The pain is at a severity of 3/10. Associated symptoms include muscle aches and nausea. Pertinent negatives include no abnormal behavior, anorexia, blurred vision, dizziness, eye watering, loss of balance, numbness, photophobia, rhinorrhea, scalp tenderness, sinus pressure, tingling, tinnitus, visual change, vomiting, weakness or weight loss. The symptoms are aggravated by unknown. She has tried NSAIDs for the symptoms. The treatment provided mild relief. Her past medical history is significant for migraine headaches. There is no history of hypertension, recent head traumas or sinus disease.    Outpatient Medications Prior to Visit  Medication Sig Dispense Refill  . levothyroxine (SYNTHROID, LEVOTHROID) 125 MCG tablet Take 100 mcg by mouth daily before breakfast.     . terbinafine (LAMISIL) 250 MG tablet Take 1 tablet (250 mg total) by mouth daily. (Patient not taking: Reported on 04/01/2017) 30 tablet 0   No facility-administered medications prior to visit.     ROS See HPI  Objective:  BP 140/80   Pulse 63   Temp 98.1 F (36.7 C)   Ht 5\' 3"  (1.6 m)   Wt 110 lb (49.9 kg)   SpO2 99%   BMI 19.49 kg/m   BP Readings from Last 3 Encounters:  04/01/17 (!) 148/88  04/01/17 140/80  01/03/17 130/74    Wt Readings from Last 3 Encounters:  04/01/17 110 lb (49.9 kg)  01/03/17 108 lb 8 oz (49.2 kg)  10/05/15 109 lb 6.4 oz (49.6 kg)    Physical Exam   Constitutional: She is oriented to person, place, and time. No distress.  HENT:  Right Ear: External ear normal.  Left Ear: External ear normal.  Nose: Nose normal.  Mouth/Throat: Oropharynx is clear and moist. No oropharyngeal exudate.  Eyes: Pupils are equal, round, and reactive to light. Conjunctivae and EOM are normal. No scleral icterus.  Neck: Normal range of motion. Neck supple. No thyromegaly present.  Cardiovascular: Normal rate and normal heart sounds.   Pulmonary/Chest: Effort normal. No stridor.  Lymphadenopathy:    She has no cervical adenopathy.  Neurological: She is alert and oriented to person, place, and time. No cranial nerve deficit. Coordination normal.  Vitals reviewed.   Lab Results  Component Value Date   WBC 6.7 01/03/2017   HGB 12.4 01/03/2017   HCT 37.0 01/03/2017   PLT 212.0 01/03/2017   GLUCOSE 90 01/03/2017   CHOL 151 01/03/2017   TRIG 79.0 01/03/2017   HDL 48.60 01/03/2017   LDLCALC 87 01/03/2017   ALT 12 01/03/2017   AST 15 01/03/2017   NA 143 01/03/2017   K 3.5 01/03/2017   CL 109 01/03/2017   CREATININE 0.69 01/03/2017   BUN 10 01/03/2017   CO2 27 01/03/2017   TSH 8.07 (H) 01/03/2017    US Soft Tissue Head/neck  Result Date: 06/28/2015 CLINICAL DATA:  Goiter EXAM: THYROID ULTRASOUND TECHNIQUE: Ultrasound examination of the thyroid gland and adjacent soft tissues was performed. COMPARISON:  None. FINDINGS:  Right thyroid lobe Measurements: 52 x 15 x 16 mm. Heterogeneous background echotexture. 8 x 5 x 6 mm nodule, inferior pole. 6 x 3 x 4 mm echogenic nodule, superior pole. Left thyroid lobe Measurements: 46 x 12 x 17 mm. 11 x 5 x 7 mm echogenic nodule without microcalcifications, deep mid lobe. Laterally a 7 x 4 x 5 mm nodule. Isthmus Thickness: 3 mm.  No nodules visualized. Lymphadenopathy None visualized. IMPRESSION: 1. Mild thyromegaly with heterogeneous echotexture and small bilateral nodules. Findings do not meet current consensus  criteria for biopsy. Follow-up by clinical exam is recommended. If patient has known risk factors for thyroid carcinoma, consider follow-up ultrasound in 12 months. If patient is clinically hyperthyroid, consider nuclear medicine thyroid uptake and scan. This recommendation follows the consensus statement: Management of Thyroid Nodules Detected as [place]: Society of Radiologists in [place]. Radiology 2005; [id number]. Electronically Signed   By: Lucrezia Europe M.D.   On: 06/28/2015 14:15    Assessment & Plan:   Dana Pace was seen today for headache.  Diagnoses and all orders for this visit:  Migraine without aura and without status migrainosus, not intractable -     Discontinue: SUMAtriptan (IMITREX) 50 MG tablet; Take 1 tablet (50 mg total) by mouth every 2 (two) hours as needed for migraine. May repeat in 2 hours if headache persists or recurs. Do not take more than 100mg  in 24hrs period -     cyclobenzaprine (FLEXERIL) 5 MG tablet; Take 1 tablet (5 mg total) by mouth at bedtime. -     acetaminophen (TYLENOL) 500 MG tablet; Take 1 tablet (500 mg total) by mouth every 6 (six) hours as needed. -     aspirin-acetaminophen-caffeine (EXCEDRIN MIGRAINE) 250-250-65 MG tablet; Take 1 tablet by mouth every 8 (eight) hours as needed for headache.   I have discontinued Dana Pace's terbinafine. I am also having her start on cyclobenzaprine, acetaminophen, and aspirin-acetaminophen-caffeine. Additionally, I am having her maintain her levothyroxine, levothyroxine, and ibuprofen.  Meds ordered this encounter  Medications  . levothyroxine (SYNTHROID, LEVOTHROID) 100 MCG tablet    Sig: TK 1 T PO QD    Refill:  8  . ibuprofen (ADVIL) 200 MG tablet    Sig: Take 200 mg by mouth every 6 (six) hours as needed.  Marland Kitchen DISCONTD: SUMAtriptan (IMITREX) 50 MG tablet    Sig: Take 1 tablet (50 mg total) by mouth every 2 (two) hours as needed for migraine. May repeat in 2 hours if headache  persists or recurs. Do not take more than 100mg  in 24hrs period    Dispense:  10 tablet    Refill:  0    Order Specific Question:   Supervising Provider    Answer:   Cassandria Anger [[id number]]  . cyclobenzaprine (FLEXERIL) 5 MG tablet    Sig: Take 1 tablet (5 mg total) by mouth at bedtime.    Dispense:  14 tablet    Refill:  0    Order Specific Question:   Supervising Provider    Answer:   Cassandria Anger [[id number]]  . acetaminophen (TYLENOL) 500 MG tablet    Sig: Take 1 tablet (500 mg total) by mouth every 6 (six) hours as needed.    Dispense:  30 tablet    Refill:  0    Order Specific Question:   Supervising Provider    Answer:   Cassandria Anger [[id number]]  . aspirin-acetaminophen-caffeine (EXCEDRIN MIGRAINE) 250-250-65 MG tablet    Sig:  Take 1 tablet by mouth every 8 (eight) hours as needed for headache.    Dispense:  30 tablet    Refill:  0    Order Specific Question:   Supervising Provider    Answer:   Cassandria Anger [[id number]]    Follow-up: Return if symptoms worsen or fail to improve.  Wilfred Lacy, NP

## 2017-04-01 NOTE — Telephone Encounter (Signed)
Pt is aware of this, take new rx and drink a lot of water.

## 2017-04-01 NOTE — Addendum Note (Signed)
Addended by: Leana Gamer on: 04/01/2017 03:31 PM   Modules accepted: Orders

## 2017-04-09 ENCOUNTER — Ambulatory Visit (INDEPENDENT_AMBULATORY_CARE_PROVIDER_SITE_OTHER)
Admission: RE | Admit: 2017-04-09 | Discharge: 2017-04-09 | Disposition: A | Discharge status: Home / Self Care | Payer: BLUE CROSS/BLUE SHIELD | Source: Ambulatory Visit | Attending: Nurse Practitioner | Admitting: Nurse Practitioner

## 2017-04-09 ENCOUNTER — Encounter: Payer: Self-pay | Admitting: Nurse Practitioner

## 2017-04-09 ENCOUNTER — Ambulatory Visit (INDEPENDENT_AMBULATORY_CARE_PROVIDER_SITE_OTHER): Payer: BLUE CROSS/BLUE SHIELD | Admitting: Nurse Practitioner

## 2017-04-09 VITALS — BP 140/82 | HR 60 | Temp 98.2°F | Ht 63.0 in | Wt 108.0 lb

## 2017-04-09 DIAGNOSIS — R519 Headache, unspecified: Secondary | ICD-10-CM

## 2017-04-09 DIAGNOSIS — G43009 Migraine without aura, not intractable, without status migrainosus: Secondary | ICD-10-CM

## 2017-04-09 DIAGNOSIS — R51 Headache: Secondary | ICD-10-CM

## 2017-04-09 NOTE — Progress Notes (Signed)
Subjective:  Patient ID: Dana Pace, female    DOB: 08/29/1971  Age: 46 y.o. MRN: [id number]  CC: Headache (not better--getting worse--something is going on with her neck? discomfort when she turns side or side)   Headache   This is a recurrent problem. The current episode started 1 to 4 weeks ago. The problem occurs daily. The problem has been unchanged. The pain is located in the left unilateral and occipital region. The pain radiates to the left neck. The pain quality is similar to prior headaches. The quality of the pain is described as aching and dull (tightness). Associated symptoms include insomnia, muscle aches and neck pain. Pertinent negatives include no abnormal behavior, back pain, blurred vision, drainage, eye watering, fever, hearing loss, loss of balance, nausea, numbness, phonophobia, photophobia, rhinorrhea, scalp tenderness, sinus pressure, sore throat, tingling, visual change or weakness. The symptoms are aggravated by activity. She has tried acetaminophen, Excedrin, NSAIDs and triptans for the symptoms. The treatment provided no relief. Her past medical history is significant for migraine headaches. There is no history of hypertension, immunosuppression, recent head traumas or sinus disease.   Persistent headache: Acute on chronic  Ongoing x 3weeks. No improvement with advil and tylenol. Unable to tolerate imitrex (dizziness) and excedrin (tremor). Did not use flexeril as prescribed.  Outpatient Medications Prior to Visit  Medication Sig Dispense Refill  . ibuprofen (ADVIL) 200 MG tablet Take 200 mg by mouth every 6 (six) hours as needed.    Marland Kitchen levothyroxine (SYNTHROID, LEVOTHROID) 100 MCG tablet TK 1 T PO QD  8  . aspirin-acetaminophen-caffeine (EXCEDRIN MIGRAINE) 250-250-65 MG tablet Take 1 tablet by mouth every 8 (eight) hours as needed for headache. 30 tablet 0  . levothyroxine (SYNTHROID, LEVOTHROID) 125 MCG tablet Take 100 mcg by mouth daily before breakfast.      . acetaminophen (TYLENOL) 500 MG tablet Take 1 tablet (500 mg total) by mouth every 6 (six) hours as needed. (Patient not taking: Reported on 04/09/2017) 30 tablet 0  . cyclobenzaprine (FLEXERIL) 5 MG tablet Take 1 tablet (5 mg total) by mouth at bedtime. (Patient not taking: Reported on 04/09/2017) 14 tablet 0   No facility-administered medications prior to visit.     ROS See HPI  Objective:  BP 140/82   Pulse 60   Temp 98.2 F (36.8 C)   Ht 5\' 3"  (1.6 m)   Wt 108 lb (49 kg)   SpO2 98%   BMI 19.13 kg/m   BP Readings from Last 3 Encounters:  04/09/17 140/82  04/01/17 137/84  04/01/17 140/80    Wt Readings from Last 3 Encounters:  04/09/17 108 lb (49 kg)  04/01/17 110 lb (49.9 kg)  01/03/17 108 lb 8 oz (49.2 kg)    Physical Exam  Constitutional: She is oriented to person, place, and time. No distress.  HENT:  Right Ear: External ear normal.  Left Ear: External ear normal.  Nose: Nose normal.  Mouth/Throat: No oropharyngeal exudate.  Eyes: Pupils are equal, round, and reactive to light. Conjunctivae and EOM are normal. No scleral icterus.  Neck: Normal range of motion. Neck supple. No thyromegaly present.  Cardiovascular: Normal rate, regular rhythm and normal heart sounds.   Pulmonary/Chest: Effort normal and breath sounds normal. No respiratory distress.  Abdominal: Soft. She exhibits no distension.  Musculoskeletal: Normal range of motion. She exhibits no edema.  Lymphadenopathy:    She has no cervical adenopathy.  Neurological: She is alert and oriented to person, place, and time.  No cranial nerve deficit. Coordination normal.  Skin: Skin is warm and dry.  Psychiatric: She has a normal mood and affect. Her behavior is normal.  Vitals reviewed.   Lab Results  Component Value Date   WBC 6.7 01/03/2017   HGB 12.4 01/03/2017   HCT 37.0 01/03/2017   PLT 212.0 01/03/2017   GLUCOSE 90 01/03/2017   CHOL 151 01/03/2017   TRIG 79.0 01/03/2017   HDL 48.60 01/03/2017    LDLCALC 87 01/03/2017   ALT 12 01/03/2017   AST 15 01/03/2017   NA 143 01/03/2017   K 3.5 01/03/2017   CL 109 01/03/2017   CREATININE 0.69 01/03/2017   BUN 10 01/03/2017   CO2 27 01/03/2017   TSH 8.07 (H) 01/03/2017    No results found.  Assessment & Plan:   Dietra was seen today for headache.  Diagnoses and all orders for this visit:  Migraine without aura and without status migrainosus, not intractable  Acute nonintractable headache, unspecified headache type -     CT Head Wo Contrast; Future   I have discontinued Ms. Shepardson's acetaminophen, aspirin-acetaminophen-caffeine, and SUMAtriptan. I am also having her maintain her levothyroxine, levothyroxine, ibuprofen, and cyclobenzaprine.  Meds ordered this encounter  Medications  . DISCONTD: SUMAtriptan (IMITREX) 50 MG tablet    Sig: TK 1 T PO PRF MIGRAINE AND MAY REPEAT IN 2 HOURS IF NEEDED. MAX OF 2 TS D    Refill:  0  . cyclobenzaprine (FLEXERIL) 5 MG tablet    Sig: Take 1 tablet (5 mg total) by mouth at bedtime.    Dispense:  14 tablet    Refill:  0    Order Specific Question:   Supervising Provider    Answer:   Cassandria Anger [[id number]]    Follow-up: No Follow-up on file.  Wilfred Lacy, NP

## 2017-04-09 NOTE — Patient Instructions (Signed)
You will be contacted with appt for head CT. Use flexeril, tylenol or naprosyn at this time. Use warm compress as needed. Encourage adequate oral hydration  General Headache Without Cause A headache is pain or discomfort felt around the head or neck area. There are many causes and types of headaches. In some cases, the cause may not be found. Follow these instructions at home: Managing pain  Take over-the-counter and prescription medicines only as told by your doctor.  Lie down in a dark, quiet room when you have a headache.  If directed, apply ice to the head and neck area: ? Put ice in a plastic bag. ? Place a towel between your skin and the bag. ? Leave the ice on for 20 minutes, 2-3 times per day.  Use a heating pad or hot shower to apply heat to the head and neck area as told by your doctor.  Keep lights dim if bright lights bother you or make your headaches worse. Eating and drinking  Eat meals on a regular schedule.  Lessen how much alcohol you drink.  Lessen how much caffeine you drink, or stop drinking caffeine. General instructions  Keep all follow-up visits as told by your doctor. This is important.  Keep a journal to find out if certain things bring on headaches. For example, write down: ? What you eat and drink. ? How much sleep you get. ? Any change to your diet or medicines.  Relax by getting a massage or doing other relaxing activities.  Lessen stress.  Sit up straight. Do not tighten (tense) your muscles.  Do not use tobacco products. This includes cigarettes, chewing tobacco, or e-cigarettes. If you need help quitting, ask your doctor.  Exercise regularly as told by your doctor.  Get enough sleep. This often means 7-9 hours of sleep. Contact a doctor if:  Your symptoms are not helped by medicine.  You have a headache that feels different than the other headaches.  You feel sick to your stomach (nauseous) or you throw up (vomit).  You have a  fever. Get help right away if:  Your headache becomes really bad.  You keep throwing up.  You have a stiff neck.  You have trouble seeing.  You have trouble speaking.  You have pain in the eye or ear.  Your muscles are weak or you lose muscle control.  You lose your balance or have trouble walking.  You feel like you will pass out (faint) or you pass out.  You have confusion. This information is not intended to replace advice given to you by your health care provider. Make sure you discuss any questions you have with your health care provider. Document Released: 05/28/2008 Document Revised: 01/25/2016 Document Reviewed: 12/12/2014 Elsevier Interactive Patient Education  [employer].

## 2017-04-14 NOTE — Telephone Encounter (Signed)
-----   Message from Shawnie Pons, LPN sent at 5/50/1586 11:41 AM EDT ----- Pt verbalize understand of test result.   Pt stated right now the only thing is working is Advil and ice. FYI

## 2017-04-24 ENCOUNTER — Encounter: Payer: Self-pay | Admitting: Neurology

## 2017-06-27 ENCOUNTER — Ambulatory Visit (INDEPENDENT_AMBULATORY_CARE_PROVIDER_SITE_OTHER): Payer: BLUE CROSS/BLUE SHIELD | Admitting: Internal Medicine

## 2017-06-27 ENCOUNTER — Encounter: Payer: Self-pay | Admitting: Internal Medicine

## 2017-06-27 VITALS — BP 128/82 | HR 66 | Temp 98.1°F | Ht 63.0 in | Wt 109.0 lb

## 2017-06-27 DIAGNOSIS — N6341 Unspecified lump in right breast, subareolar: Secondary | ICD-10-CM | POA: Diagnosis not present

## 2017-06-27 NOTE — Assessment & Plan Note (Signed)
Feels benign on exam, ordered diagnostic mammogram and ultrasound. No skin changes or nipple discharge. No trauma to the area.

## 2017-06-27 NOTE — Patient Instructions (Signed)
We will get the mammogram to be sure but this does not feel like anything to worry about.

## 2017-06-27 NOTE — Progress Notes (Signed)
   Subjective:    Patient ID: Dana Pace, female    DOB: 1971-02-08, 46 y.o.   MRN: [id number]  HPI The patient is a 46 YO female coming in for lump in breast. She noticed it yesterday and is concerned. There is family history of breast lumpectomy. She denies pain or nipple discharge. There are no skin changes. Not changed since yesterday. Slightly sore since she has been mashing on it nonstop. No trauma to the area.   Review of Systems  Constitutional: Negative.   Respiratory: Negative.   Cardiovascular: Negative.   Gastrointestinal: Negative.   Genitourinary:       Breast lump right breast  Musculoskeletal: Negative.   Skin: Negative.       Objective:   Physical Exam  Constitutional: She is oriented to person, place, and time. She appears well-developed and well-nourished.  HENT:  Head: Normocephalic and atraumatic.  Eyes: EOM are normal.  Neck: Normal range of motion.  Cardiovascular: Normal rate and regular rhythm.   Pulmonary/Chest: Effort normal and breath sounds normal. No respiratory distress. She has no wheezes. She has no rales. She exhibits no tenderness.  Left breast exam including axilla normal, right breast with small likely milk duct central medial, skin normal and nipple normal without retraction.  Abdominal: Soft.  Neurological: She is alert and oriented to person, place, and time.  Skin: Skin is warm and dry.   Vitals:   06/27/17 0949  BP: 128/82  Pulse: 66  Temp: 98.1 F (36.7 C)  TempSrc: Oral  SpO2: 98%  Weight: 109 lb (49.4 kg)  Height: 5\' 3"  (1.6 m)      Assessment & Plan:

## 2017-07-07 ENCOUNTER — Other Ambulatory Visit: Payer: Self-pay | Admitting: Internal Medicine

## 2017-07-07 DIAGNOSIS — N6341 Unspecified lump in right breast, subareolar: Secondary | ICD-10-CM

## 2017-07-09 ENCOUNTER — Other Ambulatory Visit: Payer: Self-pay | Admitting: Internal Medicine

## 2017-07-09 ENCOUNTER — Ambulatory Visit
Admission: RE | Admit: 2017-07-09 | Discharge: 2017-07-09 | Disposition: A | Discharge status: Home / Self Care | Payer: BLUE CROSS/BLUE SHIELD | Source: Ambulatory Visit | Attending: Internal Medicine | Admitting: Internal Medicine

## 2017-07-09 ENCOUNTER — Other Ambulatory Visit: Payer: BLUE CROSS/BLUE SHIELD

## 2017-07-09 DIAGNOSIS — N6341 Unspecified lump in right breast, subareolar: Secondary | ICD-10-CM

## 2017-07-09 DIAGNOSIS — N6312 Unspecified lump in the right breast, upper inner quadrant: Secondary | ICD-10-CM | POA: Diagnosis not present

## 2017-07-09 DIAGNOSIS — R922 Inconclusive mammogram: Secondary | ICD-10-CM | POA: Diagnosis not present

## 2017-07-09 DIAGNOSIS — D241 Benign neoplasm of right breast: Secondary | ICD-10-CM | POA: Diagnosis not present

## 2017-07-09 DIAGNOSIS — N6489 Other specified disorders of breast: Secondary | ICD-10-CM | POA: Diagnosis not present

## 2017-07-09 HISTORY — PX: BREAST BIOPSY: SHX20

## 2017-07-14 ENCOUNTER — Ambulatory Visit: Payer: BLUE CROSS/BLUE SHIELD | Admitting: Neurology

## 2017-09-04 ENCOUNTER — Other Ambulatory Visit (INDEPENDENT_AMBULATORY_CARE_PROVIDER_SITE_OTHER): Payer: BLUE CROSS/BLUE SHIELD

## 2017-09-04 ENCOUNTER — Ambulatory Visit: Payer: BLUE CROSS/BLUE SHIELD | Admitting: Internal Medicine

## 2017-09-04 ENCOUNTER — Other Ambulatory Visit: Payer: Self-pay | Admitting: Internal Medicine

## 2017-09-04 ENCOUNTER — Encounter: Payer: Self-pay | Admitting: Internal Medicine

## 2017-09-04 VITALS — BP 110/78 | HR 65 | Temp 98.5°F | Ht 63.0 in | Wt 110.0 lb

## 2017-09-04 DIAGNOSIS — E039 Hypothyroidism, unspecified: Secondary | ICD-10-CM

## 2017-09-04 DIAGNOSIS — R5383 Other fatigue: Secondary | ICD-10-CM

## 2017-09-04 LAB — TSH: TSH: 8.55 u[IU]/mL — ABNORMAL HIGH (ref 0.35–4.50)

## 2017-09-04 LAB — CBC
HCT: 40.8 % (ref 36.0–46.0)
Hemoglobin: 13.4 g/dL (ref 12.0–15.0)
MCHC: 32.9 g/dL (ref 30.0–36.0)
MCV: 90.6 fl (ref 78.0–100.0)
Platelets: 194 10*3/uL (ref 150.0–400.0)
RBC: 4.5 Mil/uL (ref 3.87–5.11)
RDW: 13 % (ref 11.5–15.5)
WBC: 5.4 10*3/uL (ref 4.0–10.5)

## 2017-09-04 LAB — FERRITIN: Ferritin: 64.5 ng/mL (ref 10.0–291.0)

## 2017-09-04 LAB — VITAMIN B12: Vitamin B-12: 299 pg/mL (ref 211–911)

## 2017-09-04 LAB — T4, FREE: Free T4: 0.91 ng/dL (ref 0.60–1.60)

## 2017-09-04 MED ORDER — LEVOTHYROXINE SODIUM 112 MCG PO TABS: 112.0000 ug | ORAL_TABLET | Freq: Every day | ORAL | 1 refills | Status: DC

## 2017-09-04 NOTE — Progress Notes (Signed)
   Subjective:    Patient ID: Dana Pace, female    DOB: 09/12/1970, 47 y.o.   MRN: [id number]  HPI The patient is a 47 YO female coming in for concerns about her thyroid medicine. She is wanting a higher dose of medicine. She is having some fatigue and tiredness. She is having some cold feeling all the time. She denies constipation or diarrhea. Does have dry skin worse than usual. Taking her synthroid regularly and does not miss doses. Takes separate from food.   Review of Systems  Constitutional: Positive for activity change and fatigue. Negative for appetite change, chills, fever and unexpected weight change.  Respiratory: Negative.   Cardiovascular: Negative.   Gastrointestinal: Negative.   Endocrine: Positive for cold intolerance. Negative for heat intolerance, polydipsia, polyphagia and polyuria.  Musculoskeletal: Negative.   Skin: Negative.   Neurological: Negative.   Hematological: Negative.       Objective:   Physical Exam  Constitutional: She is oriented to person, place, and time. She appears well-developed and well-nourished.  HENT:  Head: Normocephalic and atraumatic.  Eyes: EOM are normal.  Neck: Normal range of motion.  Cardiovascular: Normal rate and regular rhythm.  Pulmonary/Chest: Effort normal and breath sounds normal. No respiratory distress. She has no wheezes. She has no rales.  Abdominal: Soft. Bowel sounds are normal. She exhibits no distension. There is no tenderness. There is no rebound.  Musculoskeletal: She exhibits no edema.  Neurological: She is alert and oriented to person, place, and time. Coordination normal.  Skin: Skin is warm and dry.   Vitals:   09/04/17 0941  BP: 110/78  Pulse: 65  Temp: 98.5 F (36.9 C)  TempSrc: Oral  SpO2: 98%  Weight: 110 lb (49.9 kg)  Height: 5\' 3"  (1.6 m)      Assessment & Plan:

## 2017-09-04 NOTE — Assessment & Plan Note (Signed)
Checking TSH and free T4 and adjust dosing as needed of synthroid. Currently taking 100 mcg daily.

## 2017-09-04 NOTE — Patient Instructions (Signed)
We will check the labs today and adjust if needed.

## 2017-10-27 ENCOUNTER — Telehealth: Payer: Self-pay | Admitting: Internal Medicine

## 2017-10-27 DIAGNOSIS — E039 Hypothyroidism, unspecified: Secondary | ICD-10-CM

## 2017-10-27 NOTE — Telephone Encounter (Signed)
Placed.

## 2017-10-27 NOTE — Telephone Encounter (Signed)
Patient informed. 

## 2017-10-27 NOTE — Telephone Encounter (Signed)
Copied from [place] [id number]. Topic: Appointment Scheduling - Scheduling Inquiry for Clinic >> Oct 27, 2017 12:19 PM Cecelia Byars, NT wrote: Reason for CRM: Patient was told to have her labs checked for her thyroid in about 6  to 8 weeks ,she needs lab orders placed please call [phone] once placed

## 2017-10-31 ENCOUNTER — Other Ambulatory Visit (INDEPENDENT_AMBULATORY_CARE_PROVIDER_SITE_OTHER): Payer: BLUE CROSS/BLUE SHIELD

## 2017-10-31 DIAGNOSIS — E039 Hypothyroidism, unspecified: Secondary | ICD-10-CM | POA: Diagnosis not present

## 2017-10-31 LAB — TSH: TSH: 3.68 u[IU]/mL (ref 0.35–4.50)

## 2017-10-31 LAB — T4, FREE: Free T4: 0.94 ng/dL (ref 0.60–1.60)

## 2017-12-08 ENCOUNTER — Other Ambulatory Visit: Payer: Self-pay | Admitting: Internal Medicine

## 2017-12-08 DIAGNOSIS — N6310 Unspecified lump in the right breast, unspecified quadrant: Secondary | ICD-10-CM

## 2018-01-07 ENCOUNTER — Other Ambulatory Visit: Payer: BLUE CROSS/BLUE SHIELD

## 2018-01-12 ENCOUNTER — Ambulatory Visit
Admission: RE | Admit: 2018-01-12 | Discharge: 2018-01-12 | Disposition: A | Discharge status: Home / Self Care | Payer: BLUE CROSS/BLUE SHIELD | Source: Ambulatory Visit | Attending: Internal Medicine | Admitting: Internal Medicine

## 2018-01-12 ENCOUNTER — Other Ambulatory Visit: Payer: Self-pay | Admitting: Internal Medicine

## 2018-01-12 DIAGNOSIS — N6489 Other specified disorders of breast: Secondary | ICD-10-CM | POA: Diagnosis not present

## 2018-01-12 DIAGNOSIS — N6310 Unspecified lump in the right breast, unspecified quadrant: Secondary | ICD-10-CM

## 2018-01-12 DIAGNOSIS — R928 Other abnormal and inconclusive findings on diagnostic imaging of breast: Secondary | ICD-10-CM | POA: Diagnosis not present

## 2018-01-12 DIAGNOSIS — D241 Benign neoplasm of right breast: Secondary | ICD-10-CM | POA: Diagnosis not present

## 2018-02-05 DIAGNOSIS — F4323 Adjustment disorder with mixed anxiety and depressed mood: Secondary | ICD-10-CM | POA: Diagnosis not present

## 2018-02-25 DIAGNOSIS — F4323 Adjustment disorder with mixed anxiety and depressed mood: Secondary | ICD-10-CM | POA: Diagnosis not present

## 2018-02-28 ENCOUNTER — Other Ambulatory Visit: Payer: Self-pay | Admitting: Internal Medicine

## 2018-03-17 DIAGNOSIS — F4323 Adjustment disorder with mixed anxiety and depressed mood: Secondary | ICD-10-CM | POA: Diagnosis not present

## 2018-04-20 DIAGNOSIS — F4323 Adjustment disorder with mixed anxiety and depressed mood: Secondary | ICD-10-CM | POA: Diagnosis not present

## 2018-05-07 DIAGNOSIS — Z23 Encounter for immunization: Secondary | ICD-10-CM | POA: Diagnosis not present

## 2018-05-18 DIAGNOSIS — F4323 Adjustment disorder with mixed anxiety and depressed mood: Secondary | ICD-10-CM | POA: Diagnosis not present

## 2018-07-14 DIAGNOSIS — F4323 Adjustment disorder with mixed anxiety and depressed mood: Secondary | ICD-10-CM | POA: Diagnosis not present

## 2018-07-22 ENCOUNTER — Ambulatory Visit
Admission: RE | Admit: 2018-07-22 | Discharge: 2018-07-22 | Disposition: A | Discharge status: Home / Self Care | Payer: BLUE CROSS/BLUE SHIELD | Source: Ambulatory Visit | Attending: Internal Medicine | Admitting: Internal Medicine

## 2018-07-22 ENCOUNTER — Other Ambulatory Visit: Payer: Self-pay | Admitting: Internal Medicine

## 2018-07-22 DIAGNOSIS — N6489 Other specified disorders of breast: Secondary | ICD-10-CM | POA: Diagnosis not present

## 2018-07-22 DIAGNOSIS — D241 Benign neoplasm of right breast: Secondary | ICD-10-CM

## 2018-07-22 DIAGNOSIS — N6002 Solitary cyst of left breast: Secondary | ICD-10-CM | POA: Diagnosis not present

## 2018-07-22 DIAGNOSIS — R922 Inconclusive mammogram: Secondary | ICD-10-CM | POA: Diagnosis not present

## 2018-07-22 DIAGNOSIS — N6320 Unspecified lump in the left breast, unspecified quadrant: Secondary | ICD-10-CM

## 2018-08-03 ENCOUNTER — Ambulatory Visit: Payer: Self-pay

## 2018-08-03 NOTE — Telephone Encounter (Signed)
Pt. Reports she started having right ear ache 1-2 weeks ago - intermittently. Has noticed some dizziness with this. States "sometimes it feels like the room is spinning a little. One time I was in bed and had to lay back down.No cough, runny nose or fever noted. Appointment made - no availability with Dr. Sharlet Salina.  Reason for Disposition . Earache  (Exceptions: brief ear pain of < 60 minutes duration, earache occurring during air travel  Answer Assessment - Initial Assessment Questions 1. LOCATION: "Which ear is involved?"     Right 2. ONSET: "When did the ear start hurting"      1-2 weeks ago 3. SEVERITY: "How bad is the pain?"  (Scale 1-10; mild, moderate or severe)   - MILD (1-3): doesn't interfere with normal activities    - MODERATE (4-7): interferes with normal activities or awakens from sleep    - SEVERE (8-10): excruciating pain, unable to do any normal activities      1 4. URI SYMPTOMS: " Do you have a runny nose or cough?"     No 5. FEVER: "Do you have a fever?" If so, ask: "What is your temperature, how was it measured, and when did it start?"     No 6. CAUSE: "Have you been swimming recently?", "How often do you use Q-TIPS?", "Have you had any recent air travel or scuba diving?"     No 7. OTHER SYMPTOMS: "Do you have any other symptoms?" (e.g., headache, stiff neck, dizziness, vomiting, runny nose, decreased hearing)     Dizziness - room spins a little bit 8. PREGNANCY: "Is there any chance you are pregnant?" "When was your last menstrual period?"     No  Protocols used: EARACHE-A-AH

## 2018-08-04 ENCOUNTER — Ambulatory Visit (INDEPENDENT_AMBULATORY_CARE_PROVIDER_SITE_OTHER): Payer: BLUE CROSS/BLUE SHIELD | Admitting: Family

## 2018-08-04 ENCOUNTER — Encounter: Payer: Self-pay | Admitting: Family

## 2018-08-04 VITALS — BP 112/78 | HR 68 | Temp 98.1°F | Ht 63.0 in | Wt 111.1 lb

## 2018-08-04 DIAGNOSIS — H8309 Labyrinthitis, unspecified ear: Secondary | ICD-10-CM

## 2018-08-04 DIAGNOSIS — G43009 Migraine without aura, not intractable, without status migrainosus: Secondary | ICD-10-CM

## 2018-08-04 DIAGNOSIS — R42 Dizziness and giddiness: Secondary | ICD-10-CM

## 2018-08-04 MED ORDER — AZITHROMYCIN 250 MG PO TABS: ORAL_TABLET | ORAL | 0 refills | Status: DC

## 2018-08-04 MED ORDER — MECLIZINE HCL 25 MG PO TABS: 25.0000 mg | ORAL_TABLET | Freq: Three times a day (TID) | ORAL | 0 refills | Status: DC | PRN

## 2018-08-04 MED ORDER — CYCLOBENZAPRINE HCL 5 MG PO TABS: 5.0000 mg | ORAL_TABLET | Freq: Every evening | ORAL | 1 refills | Status: AC | PRN

## 2018-08-04 NOTE — Progress Notes (Signed)
Dana Pace is a 47 y.o. female with the following history as recorded in [employer]:  Patient Active Problem List   Diagnosis Date Noted  . Lump in central portion of right breast 06/27/2017  . Migraine headache without aura 04/01/2017  . Routine general medical examination at a health care facility 10/06/2015  . Hypothyroidism 10/06/2015    Current Outpatient Medications  Medication Sig Dispense Refill  . ibuprofen (ADVIL) 200 MG tablet Take 200 mg by mouth every 6 (six) hours as needed.    Marland Kitchen levothyroxine (SYNTHROID, LEVOTHROID) 112 MCG tablet TAKE 1 TABLET(112 MCG) BY MOUTH DAILY 90 tablet 1  . azithromycin (ZITHROMAX) 250 MG tablet 2 tabs po qd x 1 day; 1 tablet per day x 4 days; 6 tablet 0  . cyclobenzaprine (FLEXERIL) 5 MG tablet Take 1 tablet (5 mg total) by mouth at bedtime as needed for muscle spasms. 30 tablet 1  . meclizine (ANTIVERT) 25 MG tablet Take 1 tablet (25 mg total) by mouth 3 (three) times daily as needed for dizziness. 30 tablet 0   No current facility-administered medications for this visit.     Allergies: Augmentin [amoxicillin-pot clavulanate]  Past Medical History:  Diagnosis Date  . Blood transfusion without reported diagnosis   . Hx of migraine headaches   . Hypertension   . Thyroid disease     Past Surgical History:  Procedure Laterality Date  . ABDOMINAL HYSTERECTOMY    . BREAST BIOPSY Right 07/09/2017   benign    Family History  Problem Relation Age of Onset  . Mental illness Father   . Mental illness Paternal Uncle   . Alcohol abuse Paternal Uncle   . Cancer Maternal Grandmother   . Hyperlipidemia Maternal Grandmother   . Hypertension Maternal Grandmother   . Diabetes Maternal Grandmother   . Cancer Maternal Grandfather   . Breast cancer Neg Hx     Social History   Tobacco Use  . Smoking status: Former Research scientist (life sciences)  . Smokeless tobacco: Never Used  Substance Use Topics  . Alcohol use: Yes    Alcohol/week: 2.0 standard drinks   Types: 2 Standard drinks or equivalent per week    Subjective:  Right ear pain x 1-2 weeks; developed dizziness, nausea over the weekend; no fever; notes that did feel off balance/ felt spinning sensation with changing positions; no blurred vision; no numbness/ tingling in extremities; no chest pain, no palpitations;   Requesting refill on Flexeril to use as needed for migraine/ tension headaches;   LMP- Hysterectomy 2015;   Objective:  Vitals:   08/04/18 0954  BP: 112/78  Pulse: 68  Temp: 98.1 F (36.7 C)  TempSrc: Oral  SpO2: 99%  Weight: 111 lb 1.9 oz (50.4 kg)  Height: 5\' 3"  (1.6 m)    General: Well developed, well nourished, in no acute distress  Skin : Warm and dry.  Head: Normocephalic and atraumatic  Eyes: Sclera and conjunctiva clear; pupils round and reactive to light; extraocular movements intact  Ears: External normal; canals clear; tympanic membranes congested;  Oropharynx: Pink, supple. No suspicious lesions  Neck: Supple without thyromegaly, adenopathy  Lungs: Respirations unlabored; clear to auscultation bilaterally without wheeze, rales, rhonchi  CVS exam: normal rate and regular rhythm.  Neurologic: Alert and oriented; speech intact; face symmetrical; moves all extremities well; CNII-XII intact without focal deficit   Assessment:  1. Infection of the inner ear, unspecified laterality   2. Vertigo   3. Migraine without aura and without status migrainosus, not  intractable     Plan:  1. & 2. Rx for Z-pak #1 - patient knows she has been able to tolerate this antibiotic in the past; Rx for Antivert 25 mg tid prn; follow-up worse, no better- may have to use low dose prednisone; 3. Refill given on Flexeril to use prn;   No follow-ups on file.  No orders of the defined types were placed in this encounter.   Requested Prescriptions   Signed Prescriptions Disp Refills  . cyclobenzaprine (FLEXERIL) 5 MG tablet 30 tablet 1    Sig: Take 1 tablet (5 mg total) by  mouth at bedtime as needed for muscle spasms.  . meclizine (ANTIVERT) 25 MG tablet 30 tablet 0    Sig: Take 1 tablet (25 mg total) by mouth 3 (three) times daily as needed for dizziness.  Marland Kitchen azithromycin (ZITHROMAX) 250 MG tablet 6 tablet 0    Sig: 2 tabs po qd x 1 day; 1 tablet per day x 4 days;

## 2018-09-01 ENCOUNTER — Encounter: Payer: Self-pay | Admitting: Internal Medicine

## 2018-09-01 ENCOUNTER — Ambulatory Visit (INDEPENDENT_AMBULATORY_CARE_PROVIDER_SITE_OTHER): Payer: BLUE CROSS/BLUE SHIELD | Admitting: Internal Medicine

## 2018-09-01 DIAGNOSIS — N6341 Unspecified lump in right breast, subareolar: Secondary | ICD-10-CM | POA: Diagnosis not present

## 2018-09-01 NOTE — Assessment & Plan Note (Signed)
Exam is consistent with cyst or fibroadenoma. Recent US right breast 07/22/18 without this. Will get right breast US in June with left [place] to check on this. Call back if growing, hurting more or discharge sooner for evaluation.

## 2018-09-01 NOTE — Patient Instructions (Signed)
We will check this in June. Call back if changing or hurting more

## 2018-09-01 NOTE — Progress Notes (Signed)
   Subjective:   Patient ID: Dana Pace, female    DOB: 1970-09-24, 47 y.o.   MRN: [id number]  HPI The patient is a 47 YO female coming in for tenderness in the right breast. She has had this off and on. Several exams in the past. Most recent mammogram and bilateral US done 07/22/18 without signs of malignancy. She now has a lump in the area of tenderness and wants to get it evaluated. Denies increase in caffeine lately. She noticed it about 1 week ago. Does not shrink or grow but has hysterectomy so cannot track cycles. Denies nipple drainage or discharge.  Review of Systems  Constitutional: Negative.   HENT: Negative.   Eyes: Negative.   Respiratory: Negative for cough, chest tightness and shortness of breath.   Cardiovascular: Negative for chest pain, palpitations and leg swelling.  Gastrointestinal: Negative for abdominal distention, abdominal pain, constipation, diarrhea, nausea and vomiting.  Genitourinary:       Breast tenderness and lump  Musculoskeletal: Negative.   Skin: Negative.   Neurological: Negative.   Psychiatric/Behavioral: Negative.     Objective:  Physical Exam Constitutional:      Appearance: She is well-developed.  HENT:     Head: Normocephalic and atraumatic.  Neck:     Musculoskeletal: Normal range of motion.  Cardiovascular:     Rate and Rhythm: Normal rate and regular rhythm.     Comments: Breast exam right consistent with cyst close to nipple around 6-7 o'clock, no adenopathy present. Left breast exam stable and no adenopathy Pulmonary:     Effort: Pulmonary effort is normal. No respiratory distress.     Breath sounds: Normal breath sounds. No wheezing or rales.  Abdominal:     General: Bowel sounds are normal. There is no distension.     Palpations: Abdomen is soft.     Tenderness: There is no abdominal tenderness. There is no rebound.  Skin:    General: Skin is warm and dry.  Neurological:     Mental Status: She is alert and oriented to  person, place, and time.     Coordination: Coordination normal.     Vitals:   09/01/18 0915  BP: 122/78  Pulse: 69  Temp: 98.5 F (36.9 C)  TempSrc: Oral  SpO2: 99%  Weight: 113 lb (51.3 kg)  Height: 5\' 3"  (1.6 m)    Assessment & Plan:

## 2018-09-12 ENCOUNTER — Other Ambulatory Visit: Payer: Self-pay | Admitting: Internal Medicine

## 2018-10-19 ENCOUNTER — Telehealth: Payer: BLUE CROSS/BLUE SHIELD | Admitting: Family

## 2018-10-19 DIAGNOSIS — K219 Gastro-esophageal reflux disease without esophagitis: Secondary | ICD-10-CM | POA: Diagnosis not present

## 2018-10-19 MED ORDER — OMEPRAZOLE 20 MG PO CPDR: 20.0000 mg | DELAYED_RELEASE_CAPSULE | Freq: Every day | ORAL | 0 refills | Status: DC

## 2018-10-19 NOTE — Progress Notes (Signed)
We are sorry that you are not feeling well.  Here is how we plan to help!  Based on what you shared with me it looks like you most likely have Gastroesophageal Reflux Disease (GERD)  Gastroesophageal reflux disease (GERD) happens when acid from your stomach flows up into the esophagus.  When acid comes in contact with the esophagus, the acid causes sorenss (inflammation) in the esophagus.  Over time, GERD may create small holes (ulcers) in the lining of the esophagus.  I have prescribed Omeprazole, a Protein Pump inhibitor, 20 mg daily until you follow up with a provider.   I recommend that you call and make an appointment to be see to be evaluated to verify the chest pain is related to GERD. I am going to send in a prescription to see if this helps, but you still need to follow up with your PCP within the week. If your chest pain worsen at all or changes you need to go to the ED!!!!  Approximately 5 mins was spent documenting and reviewing patiient's chart.   Your symptoms should improve in the next day or two.  You can use antacids as needed until symptoms resolve.  Call us if your heartburn worsens, you have trouble swallowing, weight loss, spitting up blood or recurrent vomiting.  Home Care:  May include lifestyle changes such as weight loss, quitting smoking and alcohol consumption  Avoid foods and drinks that make your symptoms worse, such as:  Caffeine or alcoholic drinks  Chocolate  Peppermint or mint flavorings  Garlic and onions  Spicy foods  Citrus fruits, such as oranges, lemons, or limes  Tomato-based foods such as sauce, chili, salsa and pizza  Fried and fatty foods  Avoid lying down for 3 hours prior to your bedtime or prior to taking a nap  Eat small, frequent meals instead of a large meals  Wear loose-fitting clothing.  Do not wear anything tight around your waist that causes pressure on your stomach.  Raise the head of your bed 6 to 8 inches with wood  blocks to help you sleep.  Extra pillows will not help.  Seek Help Right Away If:  You have pain in your arms, neck, jaw, teeth or back  Your pain increases or changes in intensity or duration  You develop nausea, vomiting or sweating (diaphoresis)  You develop shortness of breath or you faint  Your vomit is green, yellow, black or looks like coffee grounds or blood  Your stool is red, bloody or black  These symptoms could be signs of other problems, such as heart disease, gastric bleeding or esophageal bleeding.  Make sure you :  Understand these instructions.  Will watch your condition.  Will get help right away if you are not doing well or get worse.  Your e-visit answers were reviewed by a board certified advanced clinical practitioner to complete your personal care plan.  Depending on the condition, your plan could have included both over the counter or prescription medications.  If there is a problem please reply  once you have received a response from your provider.  Your safety is important to [place].  If you have drug allergies check your prescription carefully.    You can use MyChart to ask questions about today's visit, request a non-urgent call back, or ask for a work or school excuse for 24 hours related to this e-Visit. If it has been greater than 24 hours you will need to follow up with your  provider, or enter a new e-Visit to address those concerns.  You will get an e-mail in the next two days asking about your experience.  I hope that your e-visit has been valuable and will speed your recovery. Thank you for using e-visits.

## 2018-11-17 ENCOUNTER — Ambulatory Visit (INDEPENDENT_AMBULATORY_CARE_PROVIDER_SITE_OTHER): Payer: BLUE CROSS/BLUE SHIELD | Admitting: Internal Medicine

## 2018-11-17 ENCOUNTER — Encounter: Payer: Self-pay | Admitting: Internal Medicine

## 2018-11-17 ENCOUNTER — Other Ambulatory Visit (INDEPENDENT_AMBULATORY_CARE_PROVIDER_SITE_OTHER): Payer: BLUE CROSS/BLUE SHIELD

## 2018-11-17 ENCOUNTER — Encounter: Payer: BLUE CROSS/BLUE SHIELD | Admitting: Internal Medicine

## 2018-11-17 ENCOUNTER — Other Ambulatory Visit: Payer: Self-pay | Admitting: Internal Medicine

## 2018-11-17 ENCOUNTER — Other Ambulatory Visit: Payer: Self-pay

## 2018-11-17 VITALS — BP 120/78 | HR 62 | Temp 98.3°F | Ht 63.0 in | Wt 111.0 lb

## 2018-11-17 DIAGNOSIS — K219 Gastro-esophageal reflux disease without esophagitis: Secondary | ICD-10-CM | POA: Diagnosis not present

## 2018-11-17 DIAGNOSIS — E039 Hypothyroidism, unspecified: Secondary | ICD-10-CM | POA: Diagnosis not present

## 2018-11-17 DIAGNOSIS — Z0000 Encounter for general adult medical examination without abnormal findings: Secondary | ICD-10-CM | POA: Diagnosis not present

## 2018-11-17 DIAGNOSIS — G43009 Migraine without aura, not intractable, without status migrainosus: Secondary | ICD-10-CM | POA: Diagnosis not present

## 2018-11-17 LAB — COMPREHENSIVE METABOLIC PANEL
ALT: 13 U/L (ref 0–35)
AST: 14 U/L (ref 0–37)
Albumin: 4.5 g/dL (ref 3.5–5.2)
Alkaline Phosphatase: 58 U/L (ref 39–117)
BUN: 14 mg/dL (ref 6–23)
CO2: 25 mEq/L (ref 19–32)
Calcium: 9.1 mg/dL (ref 8.4–10.5)
Chloride: 105 mEq/L (ref 96–112)
Creatinine, Ser: 0.63 mg/dL (ref 0.40–1.20)
GFR: 101.74 mL/min (ref >60.00)
Glucose, Bld: 89 mg/dL (ref 70–99)
Potassium: 3.5 mEq/L (ref 3.5–5.1)
Sodium: 139 mEq/L (ref 135–145)
Total Bilirubin: 0.5 mg/dL (ref 0.2–1.2)
Total Protein: 7.4 g/dL (ref 6.0–8.3)

## 2018-11-17 LAB — T4, FREE: Free T4: 1.05 ng/dL (ref 0.60–1.60)

## 2018-11-17 LAB — CBC
HCT: 38.7 % (ref 36.0–46.0)
Hemoglobin: 13.3 g/dL (ref 12.0–15.0)
MCHC: 34.4 g/dL (ref 30.0–36.0)
MCV: 88.1 fl (ref 78.0–100.0)
Platelets: 199 10*3/uL (ref 150.0–400.0)
RBC: 4.4 Mil/uL (ref 3.87–5.11)
RDW: 13 % (ref 11.5–15.5)
WBC: 7.8 10*3/uL (ref 4.0–10.5)

## 2018-11-17 LAB — LIPID PANEL
Cholesterol: 173 mg/dL (ref 0–200)
HDL: 43.6 mg/dL (ref >39.00)
LDL Cholesterol: 110 mg/dL — ABNORMAL HIGH (ref 0–99)
NonHDL: 129.61
Total CHOL/HDL Ratio: 4
Triglycerides: 98 mg/dL (ref 0.0–149.0)
VLDL: 19.6 mg/dL (ref 0.0–40.0)

## 2018-11-17 LAB — TSH: TSH: 10.55 u[IU]/mL — ABNORMAL HIGH (ref 0.35–4.50)

## 2018-11-17 MED ORDER — LEVOTHYROXINE SODIUM 112 MCG PO TABS: 112.0000 ug | ORAL_TABLET | Freq: Every day | ORAL | 3 refills | Status: DC

## 2018-11-17 MED ORDER — OMEPRAZOLE 20 MG PO CPDR: 20.0000 mg | DELAYED_RELEASE_CAPSULE | Freq: Every day | ORAL | 3 refills | Status: DC

## 2018-11-17 NOTE — Assessment & Plan Note (Signed)
Flu shot up to date. Tetanus up to date. Mammogram up to date, pap smear not indicated. Counseled about sun safety and mole surveillance. Counseled about the dangers of distracted driving. Given 10 year screening recommendations.

## 2018-11-17 NOTE — Assessment & Plan Note (Signed)
Checking TSH and adjust synthroid 112 mcg daily as needed.  ?

## 2018-11-17 NOTE — Patient Instructions (Signed)

## 2018-11-17 NOTE — Progress Notes (Signed)
   Subjective:   Patient ID: Dana Pace, female    DOB: 04/07/1971, 48 y.o.   MRN: [id number]  HPI The patient is a 48 YO female coming in for physical.  PMH, [place], social history reviewed and updated  Review of Systems  Constitutional: Negative.   HENT: Negative.   Eyes: Negative.   Respiratory: Negative for cough, chest tightness and shortness of breath.   Cardiovascular: Negative for chest pain, palpitations and leg swelling.  Gastrointestinal: Negative for abdominal distention, abdominal pain, constipation, diarrhea, nausea and vomiting.  Musculoskeletal: Negative.   Skin: Negative.   Neurological: Negative.   Psychiatric/Behavioral: Negative.     Objective:  Physical Exam Constitutional:      Appearance: She is well-developed.  HENT:     Head: Normocephalic and atraumatic.  Neck:     Musculoskeletal: Normal range of motion.  Cardiovascular:     Rate and Rhythm: Normal rate and regular rhythm.  Pulmonary:     Effort: Pulmonary effort is normal. No respiratory distress.     Breath sounds: Normal breath sounds. No wheezing or rales.  Abdominal:     General: Bowel sounds are normal. There is no distension.     Palpations: Abdomen is soft.     Tenderness: There is no abdominal tenderness. There is no rebound.  Skin:    General: Skin is warm and dry.  Neurological:     Mental Status: She is alert and oriented to person, place, and time.     Coordination: Coordination normal.     Vitals:   11/17/18 1401  BP: 120/78  Pulse: 62  Temp: 98.3 F (36.8 C)  TempSrc: Oral  SpO2: 99%  Weight: 111 lb (50.3 kg)  Height: 5\' 3"  (1.6 m)    Assessment & Plan:

## 2018-11-17 NOTE — Assessment & Plan Note (Signed)
Better and less frequent.

## 2019-01-12 DIAGNOSIS — Z03818 Encounter for observation for suspected exposure to other biological agents ruled out: Secondary | ICD-10-CM | POA: Diagnosis not present

## 2019-01-19 ENCOUNTER — Telehealth (HOSPITAL_COMMUNITY): Payer: Self-pay | Admitting: Internal Medicine

## 2019-01-19 ENCOUNTER — Other Ambulatory Visit (HOSPITAL_COMMUNITY)
Admission: RE | Admit: 2019-01-19 | Discharge: 2019-01-19 | Disposition: A | Discharge status: Home / Self Care | Payer: BLUE CROSS/BLUE SHIELD | Source: Ambulatory Visit | Attending: General Surgery | Admitting: General Surgery

## 2019-01-19 DIAGNOSIS — Z1159 Encounter for screening for other viral diseases: Secondary | ICD-10-CM | POA: Diagnosis not present

## 2019-01-19 LAB — SARS CORONAVIRUS 2 BY RT PCR (HOSPITAL ORDER, PERFORMED IN ~~LOC~~ HOSPITAL LAB): SARS Coronavirus 2: NEGATIVE

## 2019-01-24 NOTE — Telephone Encounter (Signed)
Encounter for covid test order placement.  LM

## 2019-02-02 ENCOUNTER — Other Ambulatory Visit: Payer: Self-pay

## 2019-02-02 ENCOUNTER — Ambulatory Visit
Admission: RE | Admit: 2019-02-02 | Discharge: 2019-02-02 | Disposition: A | Discharge status: Home / Self Care | Payer: BC Managed Care – PPO | Source: Ambulatory Visit | Attending: Internal Medicine | Admitting: Internal Medicine

## 2019-02-02 ENCOUNTER — Other Ambulatory Visit: Payer: Self-pay | Admitting: Internal Medicine

## 2019-02-02 DIAGNOSIS — N6313 Unspecified lump in the right breast, lower outer quadrant: Secondary | ICD-10-CM | POA: Diagnosis not present

## 2019-02-02 DIAGNOSIS — N6320 Unspecified lump in the left breast, unspecified quadrant: Secondary | ICD-10-CM

## 2019-02-02 DIAGNOSIS — N6310 Unspecified lump in the right breast, unspecified quadrant: Secondary | ICD-10-CM

## 2019-02-02 DIAGNOSIS — R922 Inconclusive mammogram: Secondary | ICD-10-CM | POA: Diagnosis not present

## 2019-02-02 DIAGNOSIS — N6321 Unspecified lump in the left breast, upper outer quadrant: Secondary | ICD-10-CM | POA: Diagnosis not present

## 2019-02-02 DIAGNOSIS — N63 Unspecified lump in unspecified breast: Secondary | ICD-10-CM

## 2019-03-25 ENCOUNTER — Encounter: Payer: Self-pay | Admitting: Internal Medicine

## 2019-03-26 ENCOUNTER — Other Ambulatory Visit: Payer: Self-pay

## 2019-03-26 DIAGNOSIS — Z20822 Contact with and (suspected) exposure to covid-19: Secondary | ICD-10-CM

## 2019-03-29 LAB — NOVEL CORONAVIRUS, NAA: SARS-CoV-2, NAA: NOT DETECTED

## 2019-04-26 ENCOUNTER — Encounter: Payer: Self-pay | Admitting: Internal Medicine

## 2019-05-05 ENCOUNTER — Ambulatory Visit (INDEPENDENT_AMBULATORY_CARE_PROVIDER_SITE_OTHER): Payer: BC Managed Care – PPO

## 2019-05-05 ENCOUNTER — Other Ambulatory Visit: Payer: Self-pay

## 2019-05-05 DIAGNOSIS — Z23 Encounter for immunization: Secondary | ICD-10-CM | POA: Diagnosis not present

## 2019-05-28 ENCOUNTER — Other Ambulatory Visit: Payer: Self-pay

## 2019-05-28 DIAGNOSIS — Z20822 Contact with and (suspected) exposure to covid-19: Secondary | ICD-10-CM

## 2019-05-29 LAB — NOVEL CORONAVIRUS, NAA: SARS-CoV-2, NAA: NOT DETECTED

## 2019-07-23 ENCOUNTER — Other Ambulatory Visit: Payer: Self-pay

## 2019-07-23 DIAGNOSIS — Z20822 Contact with and (suspected) exposure to covid-19: Secondary | ICD-10-CM

## 2019-07-27 LAB — NOVEL CORONAVIRUS, NAA: SARS-CoV-2, NAA: NOT DETECTED

## 2019-08-17 ENCOUNTER — Ambulatory Visit
Admission: RE | Admit: 2019-08-17 | Discharge: 2019-08-17 | Disposition: A | Discharge status: Home / Self Care | Payer: BC Managed Care – PPO | Source: Ambulatory Visit | Attending: Internal Medicine | Admitting: Internal Medicine

## 2019-08-17 ENCOUNTER — Other Ambulatory Visit: Payer: Self-pay | Admitting: Internal Medicine

## 2019-08-17 ENCOUNTER — Other Ambulatory Visit: Payer: Self-pay

## 2019-08-17 DIAGNOSIS — Z01419 Encounter for gynecological examination (general) (routine) without abnormal findings: Secondary | ICD-10-CM | POA: Diagnosis not present

## 2019-08-17 DIAGNOSIS — N63 Unspecified lump in unspecified breast: Secondary | ICD-10-CM

## 2019-08-17 DIAGNOSIS — N6324 Unspecified lump in the left breast, lower inner quadrant: Secondary | ICD-10-CM | POA: Diagnosis not present

## 2019-08-17 DIAGNOSIS — Z1151 Encounter for screening for human papillomavirus (HPV): Secondary | ICD-10-CM | POA: Diagnosis not present

## 2019-08-17 DIAGNOSIS — Z113 Encounter for screening for infections with a predominantly sexual mode of transmission: Secondary | ICD-10-CM | POA: Diagnosis not present

## 2019-08-17 DIAGNOSIS — R922 Inconclusive mammogram: Secondary | ICD-10-CM | POA: Diagnosis not present

## 2019-08-17 DIAGNOSIS — N6313 Unspecified lump in the right breast, lower outer quadrant: Secondary | ICD-10-CM | POA: Diagnosis not present

## 2019-08-17 DIAGNOSIS — Z118 Encounter for screening for other infectious and parasitic diseases: Secondary | ICD-10-CM | POA: Diagnosis not present

## 2019-08-23 ENCOUNTER — Other Ambulatory Visit: Payer: Self-pay

## 2019-08-23 DIAGNOSIS — Z20822 Contact with and (suspected) exposure to covid-19: Secondary | ICD-10-CM

## 2019-08-23 DIAGNOSIS — Z20828 Contact with and (suspected) exposure to other viral communicable diseases: Secondary | ICD-10-CM | POA: Diagnosis not present

## 2019-08-24 LAB — NOVEL CORONAVIRUS, NAA: SARS-CoV-2, NAA: NOT DETECTED

## 2019-09-17 ENCOUNTER — Other Ambulatory Visit: Payer: Self-pay | Admitting: *Deleted

## 2019-09-17 DIAGNOSIS — Z20822 Contact with and (suspected) exposure to covid-19: Secondary | ICD-10-CM | POA: Diagnosis not present

## 2019-09-18 LAB — NOVEL CORONAVIRUS, NAA: SARS-CoV-2, NAA: NOT DETECTED

## 2019-09-29 DIAGNOSIS — F4323 Adjustment disorder with mixed anxiety and depressed mood: Secondary | ICD-10-CM | POA: Diagnosis not present

## 2019-10-05 DIAGNOSIS — F4323 Adjustment disorder with mixed anxiety and depressed mood: Secondary | ICD-10-CM | POA: Diagnosis not present

## 2019-10-08 ENCOUNTER — Other Ambulatory Visit: Payer: Self-pay

## 2019-10-08 DIAGNOSIS — Z20822 Contact with and (suspected) exposure to covid-19: Secondary | ICD-10-CM

## 2019-10-10 LAB — NOVEL CORONAVIRUS, NAA: SARS-CoV-2, NAA: NOT DETECTED

## 2019-10-21 DIAGNOSIS — F4323 Adjustment disorder with mixed anxiety and depressed mood: Secondary | ICD-10-CM | POA: Diagnosis not present

## 2019-11-12 ENCOUNTER — Other Ambulatory Visit: Payer: Self-pay

## 2019-11-12 DIAGNOSIS — Z20822 Contact with and (suspected) exposure to covid-19: Secondary | ICD-10-CM

## 2019-11-13 LAB — NOVEL CORONAVIRUS, NAA: SARS-CoV-2, NAA: NOT DETECTED

## 2019-11-15 ENCOUNTER — Encounter: Payer: Self-pay | Admitting: Internal Medicine

## 2019-11-15 ENCOUNTER — Other Ambulatory Visit: Payer: Self-pay

## 2019-11-15 ENCOUNTER — Ambulatory Visit: Payer: BC Managed Care – PPO | Admitting: Internal Medicine

## 2019-11-15 VITALS — BP 138/84 | HR 64 | Temp 98.2°F | Ht 63.0 in | Wt 117.1 lb

## 2019-11-15 DIAGNOSIS — R200 Anesthesia of skin: Secondary | ICD-10-CM | POA: Diagnosis not present

## 2019-11-15 DIAGNOSIS — R202 Paresthesia of skin: Secondary | ICD-10-CM

## 2019-11-15 LAB — CBC
HCT: 40.5 % (ref 36.0–46.0)
Hemoglobin: 13.6 g/dL (ref 12.0–15.0)
MCHC: 33.5 g/dL (ref 30.0–36.0)
MCV: 89.6 fl (ref 78.0–100.0)
Platelets: 196 10*3/uL (ref 150.0–400.0)
RBC: 4.52 Mil/uL (ref 3.87–5.11)
RDW: 13 % (ref 11.5–15.5)
WBC: 7.8 10*3/uL (ref 4.0–10.5)

## 2019-11-15 LAB — COMPREHENSIVE METABOLIC PANEL
ALT: 15 U/L (ref 0–35)
AST: 16 U/L (ref 0–37)
Albumin: 4.2 g/dL (ref 3.5–5.2)
Alkaline Phosphatase: 64 U/L (ref 39–117)
BUN: 12 mg/dL (ref 6–23)
CO2: 29 mEq/L (ref 19–32)
Calcium: 9.8 mg/dL (ref 8.4–10.5)
Chloride: 102 mEq/L (ref 96–112)
Creatinine, Ser: 0.66 mg/dL (ref 0.40–1.20)
GFR: 96 mL/min (ref >60.00)
Glucose, Bld: 87 mg/dL (ref 70–99)
Potassium: 4.2 mEq/L (ref 3.5–5.1)
Sodium: 138 mEq/L (ref 135–145)
Total Bilirubin: 0.5 mg/dL (ref 0.2–1.2)
Total Protein: 7.5 g/dL (ref 6.0–8.3)

## 2019-11-15 LAB — VITAMIN B12: Vitamin B-12: 254 pg/mL (ref 211–911)

## 2019-11-15 LAB — VITAMIN D 25 HYDROXY (VIT D DEFICIENCY, FRACTURES): VITD: 22.99 ng/mL — ABNORMAL LOW (ref 30.00–100.00)

## 2019-11-15 LAB — HEMOGLOBIN A1C: Hgb A1c MFr Bld: 5.3 % (ref 4.6–6.5)

## 2019-11-15 LAB — T4, FREE: Free T4: 1.26 ng/dL (ref 0.60–1.60)

## 2019-11-15 LAB — TSH: TSH: 1.47 u[IU]/mL (ref 0.35–4.50)

## 2019-11-15 NOTE — Patient Instructions (Signed)
We are checking the labs today and then will go from them. If labs all normal we will try night time carpal tunnel brace to see if this will help.

## 2019-11-15 NOTE — Progress Notes (Signed)
   Subjective:   Patient ID: Dana Pace, female    DOB: 1970/12/10, 49 y.o.   MRN: [id number]  HPI The patient is a 49 YO female coming in for concerns about elbow pain, hand numbness. This started several months ago. Gradual onset and she is not sure if there are triggers for this. Sometimes at the end of a day (does a lot on computers for work) but has also happened without that trigger. Happened recently while driving. Mostly tingling in the hand and sometimes to the elbow. Pain in the elbow and rarely into the shoulder region. Is concerned about her thyroid levels. Denies fevers or chills. Denies injury or trauma to the right arm. Rarely some tingling in the left arm as well but mostly confined to the right arm. Has some chronic neck problems and tightness but not worse recently.   Review of Systems  Constitutional: Negative.   HENT: Negative.   Eyes: Negative.   Respiratory: Negative for cough, chest tightness and shortness of breath.   Cardiovascular: Negative for chest pain, palpitations and leg swelling.  Gastrointestinal: Negative for abdominal distention, abdominal pain, constipation, diarrhea, nausea and vomiting.  Musculoskeletal: Positive for arthralgias and myalgias. Negative for back pain, gait problem and joint swelling.  Skin: Negative.   Neurological: Positive for numbness.  Psychiatric/Behavioral: Negative.     Objective:  Physical Exam Constitutional:      Appearance: She is well-developed.  HENT:     Head: Normocephalic and atraumatic.  Cardiovascular:     Rate and Rhythm: Normal rate and regular rhythm.  Pulmonary:     Effort: Pulmonary effort is normal. No respiratory distress.     Breath sounds: Normal breath sounds. No wheezing or rales.  Abdominal:     General: Bowel sounds are normal. There is no distension.     Palpations: Abdomen is soft.     Tenderness: There is no abdominal tenderness. There is no rebound.  Musculoskeletal:        General:  Tenderness present.     Cervical back: Normal range of motion.     Comments: Some tenderness right scapular region, around right elbow ulnar side  Skin:    General: Skin is warm and dry.  Neurological:     Mental Status: She is alert and oriented to person, place, and time.     Coordination: Coordination normal.     Vitals:   11/15/19 1322  BP: 138/84  Pulse: 64  Temp: 98.2 F (36.8 C)  TempSrc: Oral  SpO2: 99%  Weight: 117 lb 2 oz (53.1 kg)  Height: 5\' 3"  (1.6 m)    This visit occurred during the SARS-CoV-2 public health emergency.  Safety protocols were in place, including screening questions prior to the visit, additional usage of staff PPE, and extensive cleaning of exam room while observing appropriate contact time as indicated for disinfecting solutions.   Assessment & Plan:

## 2019-11-16 DIAGNOSIS — R200 Anesthesia of skin: Secondary | ICD-10-CM | POA: Insufficient documentation

## 2019-11-16 DIAGNOSIS — R202 Paresthesia of skin: Secondary | ICD-10-CM | POA: Insufficient documentation

## 2019-11-16 NOTE — Assessment & Plan Note (Signed)
Checking thyroid, vitamin D and B12, HgA1c, CBC, CMP to rule out metabolic causes. Could be some nerve impingement at the wrist, elbow, shoulder, or neck causing symptoms. If no cause on labs will try carpal tunnel brace. If no relief consider nerve conduction study.

## 2019-11-18 DIAGNOSIS — F4323 Adjustment disorder with mixed anxiety and depressed mood: Secondary | ICD-10-CM | POA: Diagnosis not present

## 2019-12-23 ENCOUNTER — Other Ambulatory Visit: Payer: Self-pay | Admitting: Internal Medicine

## 2019-12-23 DIAGNOSIS — F4323 Adjustment disorder with mixed anxiety and depressed mood: Secondary | ICD-10-CM | POA: Diagnosis not present

## 2020-01-13 DIAGNOSIS — F4323 Adjustment disorder with mixed anxiety and depressed mood: Secondary | ICD-10-CM | POA: Diagnosis not present

## 2020-02-21 ENCOUNTER — Other Ambulatory Visit: Payer: BC Managed Care – PPO

## 2020-02-29 ENCOUNTER — Other Ambulatory Visit: Payer: Self-pay | Admitting: Internal Medicine

## 2020-02-29 ENCOUNTER — Ambulatory Visit
Admission: RE | Admit: 2020-02-29 | Discharge: 2020-02-29 | Disposition: A | Discharge status: Home / Self Care | Payer: BC Managed Care – PPO | Source: Ambulatory Visit | Attending: Internal Medicine | Admitting: Internal Medicine

## 2020-02-29 ENCOUNTER — Other Ambulatory Visit: Payer: Self-pay

## 2020-02-29 DIAGNOSIS — N6002 Solitary cyst of left breast: Secondary | ICD-10-CM | POA: Diagnosis not present

## 2020-02-29 DIAGNOSIS — N6313 Unspecified lump in the right breast, lower outer quadrant: Secondary | ICD-10-CM | POA: Diagnosis not present

## 2020-02-29 DIAGNOSIS — N6321 Unspecified lump in the left breast, upper outer quadrant: Secondary | ICD-10-CM | POA: Diagnosis not present

## 2020-02-29 DIAGNOSIS — N63 Unspecified lump in unspecified breast: Secondary | ICD-10-CM

## 2020-05-25 DIAGNOSIS — Z20828 Contact with and (suspected) exposure to other viral communicable diseases: Secondary | ICD-10-CM | POA: Diagnosis not present

## 2020-05-31 ENCOUNTER — Other Ambulatory Visit: Payer: Self-pay

## 2020-05-31 ENCOUNTER — Ambulatory Visit (INDEPENDENT_AMBULATORY_CARE_PROVIDER_SITE_OTHER): Payer: BC Managed Care – PPO

## 2020-05-31 DIAGNOSIS — Z23 Encounter for immunization: Secondary | ICD-10-CM

## 2020-06-02 ENCOUNTER — Telehealth (INDEPENDENT_AMBULATORY_CARE_PROVIDER_SITE_OTHER): Payer: BC Managed Care – PPO | Admitting: Internal Medicine

## 2020-06-02 ENCOUNTER — Encounter: Payer: Self-pay | Admitting: Internal Medicine

## 2020-06-02 VITALS — Temp 97.9°F | Wt 113.0 lb

## 2020-06-02 DIAGNOSIS — H66002 Acute suppurative otitis media without spontaneous rupture of ear drum, left ear: Secondary | ICD-10-CM | POA: Diagnosis not present

## 2020-06-02 MED ORDER — AMOXICILLIN-POT CLAVULANATE 875-125 MG PO TABS: 1.0000 | ORAL_TABLET | Freq: Two times a day (BID) | ORAL | 0 refills | Status: AC

## 2020-06-02 NOTE — Progress Notes (Signed)
Virtual Visit via Video Note  I connected with Dana Pace on 06/02/20 at  3:30 PM EDT by a video enabled telemedicine application and verified that I am speaking with the correct person using two identifiers.  Location patient: home Location provider: work office Persons participating in the virtual visit: patient, provider  I discussed the limitations of evaluation and management by telemedicine and the availability of in person appointments. The patient expressed understanding and agreed to proceed.   HPI: She has scheduled this visit to discuss left-sided ear pain.  Pain began 2 days ago.  She has a prior history of bilateral ear infections with tympanic membrane rupture and her symptoms felt exactly like this when that occurred..  She is having some nausea, dizziness, vertigo, she feels very fatigued.  She has not had any fever.  She had a negative Covid test on September 22.   ROS: Constitutional: Denies fever, chills, diaphoresis.  HEENT: Denies photophobia, eye pain, redness, mouth sores, trouble swallowing, neck pain, neck stiffness and tinnitus.   Respiratory: Denies SOB, DOE, cough, chest tightness,  and wheezing.   Cardiovascular: Denies chest pain, palpitations and leg swelling.  Gastrointestinal: Denies nausea, vomiting, abdominal pain, diarrhea, constipation, blood in stool and abdominal distention.  Genitourinary: Denies dysuria, urgency, frequency, hematuria, flank pain and difficulty urinating.  Endocrine: Denies: hot or cold intolerance, sweats, changes in hair or nails, polyuria, polydipsia. Musculoskeletal: Denies myalgias, back pain, joint swelling, arthralgias and gait problem.  Skin: Denies pallor, rash and wound.  Neurological: Denies dizziness, seizures, syncope, weakness, light-headedness, numbness and headaches.  Hematological: Denies adenopathy. Easy bruising, personal or family bleeding history  Psychiatric/Behavioral: Denies suicidal ideation,  mood changes, confusion, nervousness, sleep disturbance and agitation   Past Medical History:  Diagnosis Date  . Blood transfusion without reported diagnosis   . Hx of migraine headaches   . Hypertension   . Thyroid disease     Past Surgical History:  Procedure Laterality Date  . ABDOMINAL HYSTERECTOMY    . BREAST BIOPSY Right 07/09/2017   benign    Family History  Problem Relation Age of Onset  . Mental illness Father   . Mental illness Paternal Uncle   . Alcohol abuse Paternal Uncle   . Cancer Maternal Grandmother   . Hyperlipidemia Maternal Grandmother   . Hypertension Maternal Grandmother   . Diabetes Maternal Grandmother   . Cancer Maternal Grandfather   . Breast cancer Neg Hx     SOCIAL HX:   reports that she has quit smoking. She has never used smokeless tobacco. She reports current alcohol use of about 2.0 standard drinks of alcohol per week. She reports that she does not use drugs.   Current Outpatient Medications:  .  cyclobenzaprine (FLEXERIL) 5 MG tablet, Take 1 tablet (5 mg total) by mouth at bedtime as needed for muscle spasms., Disp: 30 tablet, Rfl: 1 .  levothyroxine (SYNTHROID) 112 MCG tablet, TAKE 1 TABLET(112 MCG) BY MOUTH DAILY, Disp: 90 tablet, Rfl: 3 .  amoxicillin-clavulanate (AUGMENTIN) 875-125 MG tablet, Take 1 tablet by mouth 2 (two) times daily for 7 days., Disp: 14 tablet, Rfl: 0  EXAM:   VITALS per patient if applicable: None reported  GENERAL: alert, oriented, appears well and in no acute distress  HEENT: atraumatic, conjunttiva clear, no obvious abnormalities on inspection of external nose and ears  NECK: normal movements of the head and neck  LUNGS: on inspection no signs of respiratory distress, breathing rate appears normal, no obvious  gross increased work of breathing, gasping or wheezing  CV: no obvious cyanosis  MS: moves all visible extremities without noticeable abnormality  PSYCH/NEURO: pleasant and cooperative, no  obvious depression or anxiety, speech and thought processing grossly intact  ASSESSMENT AND PLAN:   Acute suppurative otitis media of left ear without spontaneous rupture of tympanic membrane, recurrence not specified -Given her history and current symptoms, I believe treatment with antibiotics is warranted even though I am not able to do a physical ear exam today. -Will give her Augmentin twice daily for 7 days.  She does have an Augmentin allergy in her chart but this is for diarrhea.  We have discussed this.  She will increase her probiotic intake while on antibiotic therapy. -She knows to follow-up with [place] in 10 to 14 days if no significant improvement.    I discussed the assessment and treatment plan with the patient. The patient was provided an opportunity to ask questions and all were answered. The patient agreed with the plan and demonstrated an understanding of the instructions.   The patient was advised to call back or seek an in-person evaluation if the symptoms worsen or if the condition fails to improve as anticipated.    Lelon Frohlich, MD  [facility]

## 2020-06-05 ENCOUNTER — Telehealth: Payer: BC Managed Care – PPO | Admitting: Nurse Practitioner

## 2020-06-05 DIAGNOSIS — Z20828 Contact with and (suspected) exposure to other viral communicable diseases: Secondary | ICD-10-CM | POA: Diagnosis not present

## 2020-06-17 ENCOUNTER — Ambulatory Visit: Payer: BC Managed Care – PPO | Attending: Internal Medicine

## 2020-06-17 ENCOUNTER — Other Ambulatory Visit: Payer: Self-pay

## 2020-06-17 DIAGNOSIS — Z23 Encounter for immunization: Secondary | ICD-10-CM

## 2020-06-17 NOTE — Progress Notes (Signed)
   Covid-19 Vaccination Clinic  Name:  SYD MANGES    MRN: [id number] DOB: [birth year]  06/17/2020  Ms. Kittle was observed post Covid-19 immunization for 15 minutes without incident. She was provided with Vaccine Information Sheet and instruction to access the V-Safe system.   Ms. Parveen was instructed to call 911 with any severe reactions post vaccine: Marland Kitchen Difficulty breathing  . Swelling of face and throat  . A fast heartbeat  . A bad rash all over body  . Dizziness and weakness

## 2020-07-28 DIAGNOSIS — Z20822 Contact with and (suspected) exposure to covid-19: Secondary | ICD-10-CM | POA: Diagnosis not present

## 2020-08-28 ENCOUNTER — Other Ambulatory Visit: Payer: Self-pay

## 2020-08-28 ENCOUNTER — Ambulatory Visit
Admission: RE | Admit: 2020-08-28 | Discharge: 2020-08-28 | Disposition: A | Discharge status: Home / Self Care | Payer: BC Managed Care – PPO | Source: Ambulatory Visit | Attending: Internal Medicine | Admitting: Internal Medicine

## 2020-08-28 ENCOUNTER — Other Ambulatory Visit: Payer: Self-pay | Admitting: Internal Medicine

## 2020-08-28 DIAGNOSIS — R921 Mammographic calcification found on diagnostic imaging of breast: Secondary | ICD-10-CM | POA: Diagnosis not present

## 2020-08-28 DIAGNOSIS — N6002 Solitary cyst of left breast: Secondary | ICD-10-CM | POA: Diagnosis not present

## 2020-08-28 DIAGNOSIS — N63 Unspecified lump in unspecified breast: Secondary | ICD-10-CM

## 2020-08-28 DIAGNOSIS — N6313 Unspecified lump in the right breast, lower outer quadrant: Secondary | ICD-10-CM | POA: Diagnosis not present

## 2021-01-05 ENCOUNTER — Other Ambulatory Visit: Payer: Self-pay

## 2021-01-08 ENCOUNTER — Encounter: Payer: Self-pay | Admitting: Internal Medicine

## 2021-01-08 ENCOUNTER — Ambulatory Visit (INDEPENDENT_AMBULATORY_CARE_PROVIDER_SITE_OTHER): Payer: BC Managed Care – PPO | Admitting: Internal Medicine

## 2021-01-08 ENCOUNTER — Other Ambulatory Visit: Payer: Self-pay

## 2021-01-08 VITALS — BP 128/88 | HR 66 | Temp 98.3°F | Ht 63.0 in | Wt 110.6 lb

## 2021-01-08 DIAGNOSIS — Z1211 Encounter for screening for malignant neoplasm of colon: Secondary | ICD-10-CM | POA: Diagnosis not present

## 2021-01-08 DIAGNOSIS — E039 Hypothyroidism, unspecified: Secondary | ICD-10-CM

## 2021-01-08 DIAGNOSIS — Z0000 Encounter for general adult medical examination without abnormal findings: Secondary | ICD-10-CM | POA: Diagnosis not present

## 2021-01-08 LAB — COMPREHENSIVE METABOLIC PANEL
ALT: 15 U/L (ref 0–35)
AST: 18 U/L (ref 0–37)
Albumin: 4.5 g/dL (ref 3.5–5.2)
Alkaline Phosphatase: 72 U/L (ref 39–117)
BUN: 13 mg/dL (ref 6–23)
CO2: 30 mEq/L (ref 19–32)
Calcium: 9.6 mg/dL (ref 8.4–10.5)
Chloride: 103 mEq/L (ref 96–112)
Creatinine, Ser: 0.67 mg/dL (ref 0.40–1.20)
GFR: 103.57 mL/min (ref >60.00)
Glucose, Bld: 95 mg/dL (ref 70–99)
Potassium: 4 mEq/L (ref 3.5–5.1)
Sodium: 139 mEq/L (ref 135–145)
Total Bilirubin: 0.5 mg/dL (ref 0.2–1.2)
Total Protein: 7.4 g/dL (ref 6.0–8.3)

## 2021-01-08 LAB — CBC
HCT: 40.5 % (ref 36.0–46.0)
Hemoglobin: 13.7 g/dL (ref 12.0–15.0)
MCHC: 33.8 g/dL (ref 30.0–36.0)
MCV: 87.9 fl (ref 78.0–100.0)
Platelets: 216 10*3/uL (ref 150.0–400.0)
RBC: 4.61 Mil/uL (ref 3.87–5.11)
RDW: 12.5 % (ref 11.5–15.5)
WBC: 7.6 10*3/uL (ref 4.0–10.5)

## 2021-01-08 LAB — LIPID PANEL
Cholesterol: 204 mg/dL — ABNORMAL HIGH (ref 0–200)
HDL: 54.7 mg/dL (ref >39.00)
LDL Cholesterol: 125 mg/dL — ABNORMAL HIGH (ref 0–99)
NonHDL: 149.76
Total CHOL/HDL Ratio: 4
Triglycerides: 124 mg/dL (ref 0.0–149.0)
VLDL: 24.8 mg/dL (ref 0.0–40.0)

## 2021-01-08 LAB — TSH: TSH: 0.78 u[IU]/mL (ref 0.35–4.50)

## 2021-01-08 MED ORDER — LEVOTHYROXINE SODIUM 112 MCG PO TABS: 112.0000 ug | ORAL_TABLET | Freq: Every day | ORAL | 3 refills | Status: DC

## 2021-01-08 NOTE — Progress Notes (Signed)
   Subjective:   Patient ID: Dana Pace, female    DOB: Dec 05, 1970, 50 y.o.   MRN: [id number]  HPI The patient is a [name] coming in for physical.   PMH, [place], social history reviewed and updated  Review of Systems  Constitutional: Negative.   HENT: Negative.   Eyes: Negative.   Respiratory: Negative for cough, chest tightness and shortness of breath.   Cardiovascular: Negative for chest pain, palpitations and leg swelling.  Gastrointestinal: Negative for abdominal distention, abdominal pain, constipation, diarrhea, nausea and vomiting.       GERD  Musculoskeletal: Negative.   Skin: Negative.   Neurological: Negative.   Psychiatric/Behavioral: Negative.     Objective:  Physical Exam Constitutional:      Appearance: She is well-developed.  HENT:     Head: Normocephalic and atraumatic.  Cardiovascular:     Rate and Rhythm: Normal rate and regular rhythm.  Pulmonary:     Effort: Pulmonary effort is normal. No respiratory distress.     Breath sounds: Normal breath sounds. No wheezing or rales.  Abdominal:     General: Bowel sounds are normal. There is no distension.     Palpations: Abdomen is soft.     Tenderness: There is no abdominal tenderness. There is no rebound.  Musculoskeletal:     Cervical back: Normal range of motion.  Skin:    General: Skin is warm and dry.  Neurological:     Mental Status: She is alert and oriented to person, place, and time.     Coordination: Coordination normal.     Vitals:   01/08/21 1341  BP: 128/88  Pulse: 66  Temp: 98.3 F (36.8 C)  TempSrc: Oral  SpO2: 98%  Weight: 110 lb 9.6 oz (50.2 kg)  Height: 5\' 3"  (1.6 m)    This visit occurred during the SARS-CoV-2 public health emergency.  Safety protocols were in place, including screening questions prior to the visit, additional usage of staff PPE, and extensive cleaning of exam room while observing appropriate contact time as indicated for disinfecting solutions.    Assessment & Plan:

## 2021-01-08 NOTE — Patient Instructions (Signed)
Health Maintenance, Female Adopting a healthy lifestyle and getting preventive care are important in promoting health and wellness. Ask your health care provider about:  The right schedule for you to have regular tests and exams.  Things you can do on your own to prevent diseases and keep yourself healthy. What should I know about diet, weight, and exercise? Eat a healthy diet  Eat a diet that includes plenty of vegetables, fruits, low-fat dairy products, and lean protein.  Do not eat a lot of foods that are high in solid fats, added sugars, or sodium.   Maintain a healthy weight Body mass index (BMI) is used to identify weight problems. It estimates body fat based on height and weight. Your health care provider can help determine your BMI and help you achieve or maintain a healthy weight. Get regular exercise Get regular exercise. This is one of the most important things you can do for your health. Most adults should:  Exercise for at least 150 minutes each week. The exercise should increase your heart rate and make you sweat (moderate-intensity exercise).  Do strengthening exercises at least twice a week. This is in addition to the moderate-intensity exercise.  Spend less time sitting. Even light physical activity can be beneficial. Watch cholesterol and blood lipids Have your blood tested for lipids and cholesterol at 48 years of age, then have this test every 5 years. Have your cholesterol levels checked more often if:  Your lipid or cholesterol levels are high.  You are older than 48 years of age.  You are at high risk for heart disease. What should I know about cancer screening? Depending on your health history and family history, you may need to have cancer screening at various ages. This may include screening for:  Breast cancer.  Cervical cancer.  Colorectal cancer.  Skin cancer.  Lung cancer. What should I know about heart disease, diabetes, and high blood  pressure? Blood pressure and heart disease  High blood pressure causes heart disease and increases the risk of stroke. This is more likely to develop in people who have high blood pressure readings, are of African descent, or are overweight.  Have your blood pressure checked: ? Every 3-5 years if you are 18-39 years of age. ? Every year if you are 40 years old or older. Diabetes Have regular diabetes screenings. This checks your fasting blood sugar level. Have the screening done:  Once every three years after age 40 if you are at a normal weight and have a low risk for diabetes.  More often and at a younger age if you are overweight or have a high risk for diabetes. What should I know about preventing infection? Hepatitis B If you have a higher risk for hepatitis B, you should be screened for this virus. Talk with your health care provider to find out if you are at risk for hepatitis B infection. Hepatitis C Testing is recommended for:  Everyone born from 1945 through 1965.  Anyone with known risk factors for hepatitis C. Sexually transmitted infections (STIs)  Get screened for STIs, including gonorrhea and chlamydia, if: ? You are sexually active and are younger than 48 years of age. ? You are older than 48 years of age and your health care provider tells you that you are at risk for this type of infection. ? Your sexual activity has changed since you were last screened, and you are at increased risk for chlamydia or gonorrhea. Ask your health care provider   if you are at risk.  Ask your health care provider about whether you are at high risk for HIV. Your health care provider may recommend a prescription medicine to help prevent HIV infection. If you choose to take medicine to prevent HIV, you should first get tested for HIV. You should then be tested every 3 months for as long as you are taking the medicine. Pregnancy  If you are about to stop having your period (premenopausal) and  you may become pregnant, seek counseling before you get pregnant.  Take 400 to 800 micrograms (mcg) of folic acid every day if you become pregnant.  Ask for birth control (contraception) if you want to prevent pregnancy. Osteoporosis and menopause Osteoporosis is a disease in which the bones lose minerals and strength with aging. This can result in bone fractures. If you are 65 years old or older, or if you are at risk for osteoporosis and fractures, ask your health care provider if you should:  Be screened for bone loss.  Take a calcium or vitamin D supplement to lower your risk of fractures.  Be given hormone replacement therapy (HRT) to treat symptoms of menopause. Follow these instructions at home: Lifestyle  Do not use any products that contain nicotine or tobacco, such as cigarettes, e-cigarettes, and chewing tobacco. If you need help quitting, ask your health care provider.  Do not use street drugs.  Do not share needles.  Ask your health care provider for help if you need support or information about quitting drugs. Alcohol use  Do not drink alcohol if: ? Your health care provider tells you not to drink. ? You are pregnant, may be pregnant, or are planning to become pregnant.  If you drink alcohol: ? Limit how much you use to 0-1 drink a day. ? Limit intake if you are breastfeeding.  Be aware of how much alcohol is in your drink. In the [place] one drink equals one 12 oz bottle of beer (355 mL), one 5 oz glass of wine (148 mL), or one 1 oz glass of hard liquor (44 mL). General instructions  Schedule regular health, dental, and eye exams.  Stay current with your vaccines.  Tell your health care provider if: ? You often feel depressed. ? You have ever been abused or do not feel safe at home. Summary  Adopting a healthy lifestyle and getting preventive care are important in promoting health and wellness.  Follow your health care provider's instructions about healthy  diet, exercising, and getting tested or screened for diseases.  Follow your health care provider's instructions on monitoring your cholesterol and blood pressure. This information is not intended to replace advice given to you by your health care provider. Make sure you discuss any questions you have with your health care provider. Document Revised: 08/12/2018 Document Reviewed: 08/12/2018 Elsevier Patient Education  2021 Elsevier Inc.  

## 2021-01-09 NOTE — Assessment & Plan Note (Signed)
Flu shot yearly. Covid-19 counseled up to date. Tetanus up to date. Colonoscopy referral to GI. Mammogram up to date, pap smear up to date. Counseled about sun safety and mole surveillance. Counseled about the dangers of distracted driving. Given 10 year screening recommendations.

## 2021-01-09 NOTE — Assessment & Plan Note (Signed)
Checking TSH and adjust synthroid 112 mcg daily as needed.  ?

## 2021-01-25 DIAGNOSIS — F4320 Adjustment disorder, unspecified: Secondary | ICD-10-CM | POA: Diagnosis not present

## 2021-02-01 DIAGNOSIS — F4320 Adjustment disorder, unspecified: Secondary | ICD-10-CM | POA: Diagnosis not present

## 2021-02-02 DIAGNOSIS — F411 Generalized anxiety disorder: Secondary | ICD-10-CM | POA: Diagnosis not present

## 2021-02-08 DIAGNOSIS — F4320 Adjustment disorder, unspecified: Secondary | ICD-10-CM | POA: Diagnosis not present

## 2021-02-20 ENCOUNTER — Encounter: Payer: Self-pay | Admitting: Internal Medicine

## 2021-02-26 DIAGNOSIS — F4320 Adjustment disorder, unspecified: Secondary | ICD-10-CM | POA: Diagnosis not present

## 2021-03-01 DIAGNOSIS — F411 Generalized anxiety disorder: Secondary | ICD-10-CM | POA: Diagnosis not present

## 2021-03-26 DIAGNOSIS — F4320 Adjustment disorder, unspecified: Secondary | ICD-10-CM | POA: Diagnosis not present

## 2021-04-02 DIAGNOSIS — F4320 Adjustment disorder, unspecified: Secondary | ICD-10-CM | POA: Diagnosis not present

## 2021-04-08 DIAGNOSIS — Z20822 Contact with and (suspected) exposure to covid-19: Secondary | ICD-10-CM | POA: Diagnosis not present

## 2021-04-26 DIAGNOSIS — F411 Generalized anxiety disorder: Secondary | ICD-10-CM | POA: Diagnosis not present

## 2021-05-29 ENCOUNTER — Encounter: Payer: Self-pay | Admitting: Internal Medicine

## 2021-06-11 ENCOUNTER — Ambulatory Visit (AMBULATORY_SURGERY_CENTER): Payer: BC Managed Care – PPO | Admitting: *Deleted

## 2021-06-11 ENCOUNTER — Encounter: Payer: Self-pay | Admitting: Internal Medicine

## 2021-06-11 ENCOUNTER — Other Ambulatory Visit: Payer: Self-pay

## 2021-06-11 VITALS — Ht 63.0 in | Wt 118.0 lb

## 2021-06-11 DIAGNOSIS — Z1211 Encounter for screening for malignant neoplasm of colon: Secondary | ICD-10-CM

## 2021-06-11 MED ORDER — PLENVU 140 G PO SOLR: 1.0000 | ORAL | 0 refills | Status: DC

## 2021-06-11 NOTE — Progress Notes (Signed)
Pt verified name, DOB, address and insurance during PV today.  Pt mailed instruction packet of Emmi video, copy of consent form to read and not return, and instructions. Plenvu  coupon mailed in packet. PV completed over the phone.  Pt encouraged to call with questions or issues.  My Chart instructions to pt as well    No egg or soy allergy known to patient  No issues known to pt with past sedation with any surgeries or procedures Patient denies ever being told they had issues or difficulty with intubation  No FH of Malignant Hyperthermia Pt is not on diet pills Pt is not on  home 02  Pt is not on blood thinners  Pt denies issues with constipation  No A fib or A flutter  Pt is fully vaccinated  for Covid   Plenvu  Coupon given to pt in PV today , Code to Pharmacy and  NO PA's for preps discussed with pt In PV today  Discussed with pt there will be an out-of-pocket cost for prep and that varies from $0 to 70 +  dollars - pt verbalized understanding   Due to the COVID-19 pandemic we are asking patients to follow certain guidelines in PV and the [place]   Pt aware of COVID protocols and [facility] guidelines

## 2021-06-21 DIAGNOSIS — F411 Generalized anxiety disorder: Secondary | ICD-10-CM | POA: Diagnosis not present

## 2021-06-28 ENCOUNTER — Ambulatory Visit: Payer: BC Managed Care – PPO | Admitting: Internal Medicine

## 2021-06-28 ENCOUNTER — Ambulatory Visit
Admission: RE | Admit: 2021-06-28 | Discharge: 2021-06-28 | Disposition: A | Discharge status: Home / Self Care | Payer: BC Managed Care – PPO | Source: Ambulatory Visit | Attending: Internal Medicine | Admitting: Internal Medicine

## 2021-06-28 ENCOUNTER — Other Ambulatory Visit: Payer: Self-pay

## 2021-06-28 ENCOUNTER — Encounter: Payer: Self-pay | Admitting: Internal Medicine

## 2021-06-28 ENCOUNTER — Encounter: Payer: BC Managed Care – PPO | Admitting: Internal Medicine

## 2021-06-28 VITALS — BP 118/90 | HR 65 | Resp 18 | Ht 63.0 in | Wt 118.2 lb

## 2021-06-28 DIAGNOSIS — R1011 Right upper quadrant pain: Secondary | ICD-10-CM

## 2021-06-28 DIAGNOSIS — K7689 Other specified diseases of liver: Secondary | ICD-10-CM | POA: Diagnosis not present

## 2021-06-28 LAB — COMPREHENSIVE METABOLIC PANEL
ALT: 14 U/L (ref 0–35)
AST: 14 U/L (ref 0–37)
Albumin: 4.3 g/dL (ref 3.5–5.2)
Alkaline Phosphatase: 71 U/L (ref 39–117)
BUN: 12 mg/dL (ref 6–23)
CO2: 30 mEq/L (ref 19–32)
Calcium: 9.5 mg/dL (ref 8.4–10.5)
Chloride: 105 mEq/L (ref 96–112)
Creatinine, Ser: 0.63 mg/dL (ref 0.40–1.20)
GFR: 104.77 mL/min (ref >60.00)
Glucose, Bld: 88 mg/dL (ref 70–99)
Potassium: 4.3 mEq/L (ref 3.5–5.1)
Sodium: 139 mEq/L (ref 135–145)
Total Bilirubin: 0.6 mg/dL (ref 0.2–1.2)
Total Protein: 7.4 g/dL (ref 6.0–8.3)

## 2021-06-28 LAB — LIPASE: Lipase: 19 U/L (ref 11.0–59.0)

## 2021-06-28 LAB — CBC
HCT: 39.9 % (ref 36.0–46.0)
Hemoglobin: 13.3 g/dL (ref 12.0–15.0)
MCHC: 33.2 g/dL (ref 30.0–36.0)
MCV: 88.8 fl (ref 78.0–100.0)
Platelets: 227 10*3/uL (ref 150.0–400.0)
RBC: 4.49 Mil/uL (ref 3.87–5.11)
RDW: 13.3 % (ref 11.5–15.5)
WBC: 7.2 10*3/uL (ref 4.0–10.5)

## 2021-06-28 MED ORDER — ONDANSETRON HCL 4 MG PO TABS: 4.0000 mg | ORAL_TABLET | Freq: Three times a day (TID) | ORAL | 0 refills | Status: DC | PRN

## 2021-06-28 NOTE — Patient Instructions (Signed)
We will check the labs and the ultrasound.  Try tylenol for the pain and we have sent in zofran for nausea to use if needed.

## 2021-06-28 NOTE — Progress Notes (Signed)
   Subjective:   Patient ID: Dana Pace, female    DOB: 03-20-1971, 50 y.o.   MRN: [id number]  HPI The patient is a [name] coming in for RUQ pain and nausea.   Review of Systems  Constitutional: Negative.   HENT: Negative.    Eyes: Negative.   Respiratory:  Negative for cough, chest tightness and shortness of breath.   Cardiovascular:  Negative for chest pain, palpitations and leg swelling.  Gastrointestinal:  Positive for abdominal pain and nausea. Negative for abdominal distention, anal bleeding, blood in stool, constipation, diarrhea and vomiting.  Genitourinary:  Negative for dysuria and frequency.  Musculoskeletal: Negative.   Skin: Negative.   Neurological: Negative.   Psychiatric/Behavioral: Negative.     Objective:  Physical Exam Constitutional:      Appearance: She is well-developed.  HENT:     Head: Normocephalic and atraumatic.  Cardiovascular:     Rate and Rhythm: Normal rate and regular rhythm.  Pulmonary:     Effort: Pulmonary effort is normal. No respiratory distress.     Breath sounds: Normal breath sounds. No wheezing or rales.  Abdominal:     General: Bowel sounds are normal. There is no distension.     Palpations: Abdomen is soft.     Tenderness: There is abdominal tenderness. There is no rebound.     Comments: Tenderness RUQ, not right flank and not generalized  Musculoskeletal:     Cervical back: Normal range of motion.  Skin:    General: Skin is warm and dry.  Neurological:     Mental Status: She is alert and oriented to person, place, and time.     Coordination: Coordination normal.    Vitals:   06/28/21 0834  BP: 118/90  Pulse: 65  Resp: 18  SpO2: 99%  Weight: 118 lb 3.2 oz (53.6 kg)  Height: 5\' 3"  (1.6 m)    This visit occurred during the SARS-CoV-2 public health emergency.  Safety protocols were in place, including screening questions prior to the visit, additional usage of staff PPE, and extensive cleaning of exam room  while observing appropriate contact time as indicated for disinfecting solutions.   Assessment & Plan:  Visit time 20 minutes in face to face communication with patient and coordination of care, additional 10 minutes spent in record review, coordination or care, ordering tests, communicating/referring to other healthcare professionals, documenting in medical records all on the same day of the visit for total time 30 minutes spent on the visit.

## 2021-06-28 NOTE — Assessment & Plan Note (Signed)
Checking CBC, CMP, lipase to rule out infection, kidney/liver problems and pancreatitis. Ordered stat RUQ [place] to rule out cholecystitis given prolonged pain and nausea. Rx zofran.

## 2021-06-29 ENCOUNTER — Other Ambulatory Visit: Payer: Self-pay | Admitting: Internal Medicine

## 2021-06-29 DIAGNOSIS — K769 Liver disease, unspecified: Secondary | ICD-10-CM

## 2021-07-04 ENCOUNTER — Encounter: Payer: Self-pay | Admitting: Internal Medicine

## 2021-07-17 ENCOUNTER — Other Ambulatory Visit: Payer: Self-pay

## 2021-07-17 ENCOUNTER — Ambulatory Visit
Admission: RE | Admit: 2021-07-17 | Discharge: 2021-07-17 | Disposition: A | Discharge status: Home / Self Care | Payer: BC Managed Care – PPO | Source: Ambulatory Visit | Attending: Internal Medicine | Admitting: Internal Medicine

## 2021-07-17 DIAGNOSIS — K769 Liver disease, unspecified: Secondary | ICD-10-CM

## 2021-07-17 DIAGNOSIS — K7689 Other specified diseases of liver: Secondary | ICD-10-CM | POA: Diagnosis not present

## 2021-07-17 DIAGNOSIS — N281 Cyst of kidney, acquired: Secondary | ICD-10-CM | POA: Diagnosis not present

## 2021-07-17 MED ORDER — GADOBENATE DIMEGLUMINE 529 MG/ML IV SOLN
11.0000 mL | Freq: Once | INTRAVENOUS | Status: AC | PRN
  Administered 2021-07-17: 11 mL via INTRAVENOUS

## 2021-07-19 ENCOUNTER — Encounter: Payer: BC Managed Care – PPO | Admitting: Internal Medicine

## 2021-07-20 ENCOUNTER — Telehealth: Payer: Self-pay | Admitting: Internal Medicine

## 2021-07-20 DIAGNOSIS — K7689 Other specified diseases of liver: Secondary | ICD-10-CM

## 2021-07-20 DIAGNOSIS — R1011 Right upper quadrant pain: Secondary | ICD-10-CM

## 2021-07-20 NOTE — Telephone Encounter (Signed)
We could send her to GI specialist to see if anything can be done about them if she wants.

## 2021-07-20 NOTE — Telephone Encounter (Signed)
Patient calling in about her MRI results  Patient says she seen message provider left via mychart but she is still concerned about the cysts as they are causing her some discomfort & she wants to know what can be done about it  Please call patient [phone]

## 2021-07-20 NOTE — Telephone Encounter (Signed)
See below

## 2021-07-24 NOTE — Addendum Note (Signed)
Addended by: Pricilla Holm A on: 07/24/2021 12:16 PM   Modules accepted: Orders

## 2021-07-24 NOTE — Telephone Encounter (Signed)
Called pt twice phone would not ring. Phone number dialed on file. Mychart message will be sent as well.

## 2021-07-24 NOTE — Telephone Encounter (Signed)
Referral done for as soon as possible

## 2021-07-24 NOTE — Telephone Encounter (Addendum)
Patient's response via mychart   Yes, please. Since my visit with Dr. Sharlet Pace, I am having more symptoms. I cannot lay on either side to sleep, I am having more pain in my back (right mostly but also left and upper center)  intermittent pain in my left and upper center abdomen , pressure when I bend over, everything is pushed up and uncomfortable when I sit

## 2021-08-07 ENCOUNTER — Other Ambulatory Visit: Payer: Self-pay

## 2021-08-07 ENCOUNTER — Telehealth: Payer: Self-pay | Admitting: Internal Medicine

## 2021-08-07 ENCOUNTER — Other Ambulatory Visit (INDEPENDENT_AMBULATORY_CARE_PROVIDER_SITE_OTHER): Payer: BC Managed Care – PPO

## 2021-08-07 ENCOUNTER — Encounter: Payer: Self-pay | Admitting: Internal Medicine

## 2021-08-07 DIAGNOSIS — E039 Hypothyroidism, unspecified: Secondary | ICD-10-CM

## 2021-08-07 LAB — TSH: TSH: 1.65 u[IU]/mL (ref 0.35–5.50)

## 2021-08-07 LAB — T4, FREE: Free T4: 1.19 ng/dL (ref 0.60–1.60)

## 2021-08-07 NOTE — Telephone Encounter (Signed)
Inbound call from patient, called to cancel her colonoscopy for tomorrow at 3:30.  States she is having health issues at the moment and will call back at a later time to reschedule.

## 2021-08-08 ENCOUNTER — Encounter: Payer: BC Managed Care – PPO | Admitting: Internal Medicine

## 2021-08-10 ENCOUNTER — Other Ambulatory Visit: Payer: Self-pay | Admitting: Internal Medicine

## 2021-08-10 DIAGNOSIS — Z1231 Encounter for screening mammogram for malignant neoplasm of breast: Secondary | ICD-10-CM

## 2021-09-06 ENCOUNTER — Ambulatory Visit
Admission: RE | Admit: 2021-09-06 | Discharge: 2021-09-06 | Disposition: A | Discharge status: Home / Self Care | Payer: BC Managed Care – PPO | Source: Ambulatory Visit

## 2021-09-06 DIAGNOSIS — Z1231 Encounter for screening mammogram for malignant neoplasm of breast: Secondary | ICD-10-CM | POA: Diagnosis not present

## 2021-09-07 ENCOUNTER — Other Ambulatory Visit: Payer: Self-pay | Admitting: Internal Medicine

## 2021-09-07 DIAGNOSIS — R928 Other abnormal and inconclusive findings on diagnostic imaging of breast: Secondary | ICD-10-CM

## 2021-09-13 DIAGNOSIS — F411 Generalized anxiety disorder: Secondary | ICD-10-CM | POA: Diagnosis not present

## 2021-09-14 ENCOUNTER — Encounter: Payer: Self-pay | Admitting: Internal Medicine

## 2021-09-14 ENCOUNTER — Ambulatory Visit (INDEPENDENT_AMBULATORY_CARE_PROVIDER_SITE_OTHER): Payer: BC Managed Care – PPO | Admitting: Internal Medicine

## 2021-09-14 VITALS — BP 122/74 | HR 63 | Ht 63.0 in | Wt 123.4 lb

## 2021-09-14 DIAGNOSIS — K7689 Other specified diseases of liver: Secondary | ICD-10-CM

## 2021-09-14 DIAGNOSIS — R1011 Right upper quadrant pain: Secondary | ICD-10-CM

## 2021-09-14 DIAGNOSIS — Z1211 Encounter for screening for malignant neoplasm of colon: Secondary | ICD-10-CM | POA: Diagnosis not present

## 2021-09-14 NOTE — Patient Instructions (Signed)
If you are age 49 or older, your body mass index should be between 23-30. Your Body mass index is 21.85 kg/m. If this is out of the aforementioned range listed, please consider follow up with your Primary Care Provider.  If you are age 62 or younger, your body mass index should be between 19-25. Your Body mass index is 21.85 kg/m. If this is out of the aformentioned range listed, please consider follow up with your Primary Care Provider.   ________________________________________________________  The [facility] providers would like to encourage you to use [facility] to communicate with providers for non-urgent requests or questions.  Due to long hold times on the telephone, sending your provider a message by [facility] may be a faster and more efficient way to get a response.  Please allow 48 business hours for a response.  Please remember that this is for non-urgent requests.  _______________________________________________________  Take Pepcid 20mg  daily at bedtime  Try to take Tylenol instead of Ibuprofen.  Please follow up with Dr. Lorenso Courier in 2 months

## 2021-09-14 NOTE — Progress Notes (Signed)
Chief Complaint: Hepatic cyst, RUQ ab pain  HPI : 51 year old female with history of anxiety, migraines, hypothyroidism, and HTN presents with RUQ ab pain  She has intermittent RUQ ab pain. Sometimes the pain will feel like a pressure. Bending and reaching will make the pain worse. Denies changes in pain when having a BM or with eating. When she pulls her legs up, she feels like the pain gets worse. Endorses nausea. Denies vomiting. Has been using Zofran PRN to help with nausea. Denies dysphagia. Denies blood in stools. She is eating and drinking normally. Weight has been stable. She had a partial hysterectomy in the past. Paternal grandmother had some abdominal cancer but unclear exactly what kind. Denies chest burning or regurgitation. She will take 3-4 Advil a couple of times per week.   Past Medical History:  Diagnosis Date   Anxiety    Blood transfusion without reported diagnosis    Hx of migraine headaches    Hypertension    controlled   Thyroid disease      Past Surgical History:  Procedure Laterality Date   ABDOMINAL HYSTERECTOMY     BREAST BIOPSY Right 07/09/2017   benign   DILATION AND CURETTAGE OF UTERUS     x2   WISDOM TOOTH EXTRACTION     Family History  Problem Relation Age of Onset   Mental illness Father    Mental illness Paternal Uncle    Alcohol abuse Paternal Uncle    Cancer Maternal Grandmother    Hyperlipidemia Maternal Grandmother    Hypertension Maternal Grandmother    Diabetes Maternal Grandmother    Cancer Maternal Grandfather    Stomach cancer Paternal Grandmother    Breast cancer Neg Hx    Colon polyps Neg Hx    Colon cancer Neg Hx    Esophageal cancer Neg Hx    Rectal cancer Neg Hx    Social History   Tobacco Use   Smoking status: Former   Smokeless tobacco: Never  Vaping Use   Vaping Use: Never used  Substance Use Topics   Alcohol use: Yes    Alcohol/week: 2.0 standard drinks    Types: 2 Standard drinks or equivalent per week    Drug use: No   Current Outpatient Medications  Medication Sig Dispense Refill   cholecalciferol (VITAMIN D3) 25 MCG (1000 UNIT) tablet Take 1,000 Units by mouth daily.     cyclobenzaprine (FLEXERIL) 5 MG tablet Take 1 tablet (5 mg total) by mouth at bedtime as needed for muscle spasms. 30 tablet 1   escitalopram (LEXAPRO) 20 MG tablet Take 20 mg by mouth daily.     levothyroxine (SYNTHROID) 112 MCG tablet Take 1 tablet (112 mcg total) by mouth daily before breakfast. 90 tablet 3   LORazepam (ATIVAN) 0.5 MG tablet Take 0.5 mg by mouth daily as needed.     ondansetron (ZOFRAN) 4 MG tablet Take 1 tablet (4 mg total) by mouth every 8 (eight) hours as needed for nausea or vomiting. 20 tablet 0   PEG-KCl-NaCl-NaSulf-Na Asc-C (PLENVU) 140 g SOLR Take 1 kit by mouth as directed. Manufacturer's coupon Universal coupon code:BIN: P2366821; GROUP: [id number]; PCN: [id number]; ID: [id number]; PAY NO MORE $50; NO prior authorization 1 each 0   No current facility-administered medications for this visit.   Allergies  Allergen Reactions   Augmentin [Amoxicillin-Pot Clavulanate]     Diarrhea/ can take PCN   Prednisone Other (See Comments)    Numbness to face  Review of Systems: All systems reviewed and negative except where noted in HPI.   Physical Exam: BP 122/74    Pulse 63    Ht 5' 3"  (1.6 m)    Wt 123 lb 6 oz (56 kg)    BMI 21.85 kg/m  Constitutional: Pleasant,well-developed, female in no acute distress. HEENT: Normocephalic and atraumatic. Conjunctivae are normal. No scleral icterus. Cardiovascular: Normal rate, regular rhythm.  Pulmonary/chest: Effort normal and breath sounds normal. No wheezing, rales or rhonchi. Abdominal: Soft, nondistended, tender in the RUQ. Bowel sounds active throughout. There are no masses palpable. No hepatomegaly. Extremities: No edema Neurological: Alert and oriented to person place and time. Skin: Skin is warm and dry. No rashes noted. Psychiatric: Normal mood and  affect. Behavior is normal.  Labs 06/2021: CBC and CMP unremarkable. Lipase nml.  Labs 08/2021: TSH nml  RUQ U/S 06/28/21: IMPRESSION: Massive enlargement of multiple hepatic cysts in comparison to prior ultrasound in 2006, measuring up to 13.5 cm in right hepatic lobe and 12.0 cm in left hepatic lobe, with complex appearance. This could potentially be a source of abdominal pain and can be further evaluated with nonemergent outpatient MRI with and without contrast. Normal appearance of the gallbladder.  MR Abd 07/18/21: IMPRESSION: Numerous bilobar hepatic cysts measuring up to 13.7 cm. No suspicious hepatic lesion.  ASSESSMENT AND PLAN:  Liver cysts RUQ ab pain Colon cancer screening Patient's presents with benign liver cysts that have been enlarging over time.  Certainly her abdominal pain could be due to the liver cysts, the largest of which is 13.7 cm.  However we will need to rule out alternative etiologies.  We will plan to start her on some GERD therapy and perform an EGD to rule out PUD.  Her prior abdominal imaging has not revealed any signs of gallstones.  Fortunately patient has not been tolerant of PPI therapy in the past since it has interfered with absorption of her thyroid medications. - Start Pepcid 20 mg QHS. Can consider switching to PPI nightly in the future - Try to switch from using ibuprofen to Tylenol - Patient will continue Zofran - EGD/colonoscopy [facility] - RTC in 2 months. If she is still not feeling well and no alternative etiology for her abdominal pain is found, then will consider surgery referral at that time for consideration of surgical resection of her liver cysts  [name], MD

## 2021-09-15 IMAGING — US US BREAST*L* LIMITED INC AXILLA
1 series · 9 of 9 positions shown · non-contrast
Comparison: Previous exam(s).

CLINICAL DATA: Follow-up for 2 probably benign fibroadenomas in the
RIGHT breast at 7 o'clock axis, originally identified on ultrasound
of 02/02/2019.

[Series 1: us breast*left* limited inc axilla · 0.06mm/px · 9 of 9 slices shown]
[im 1/9]
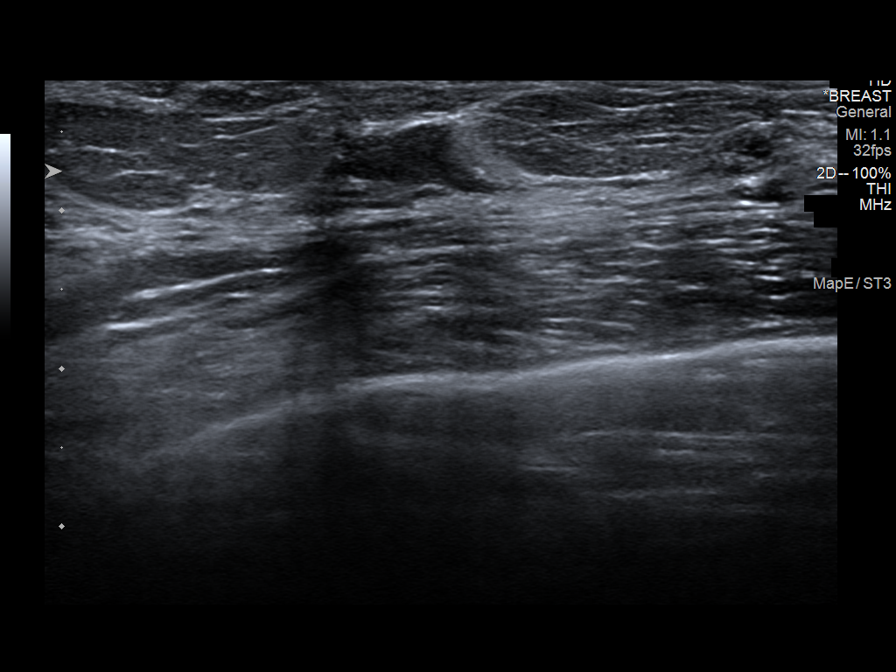
[im 2/9]
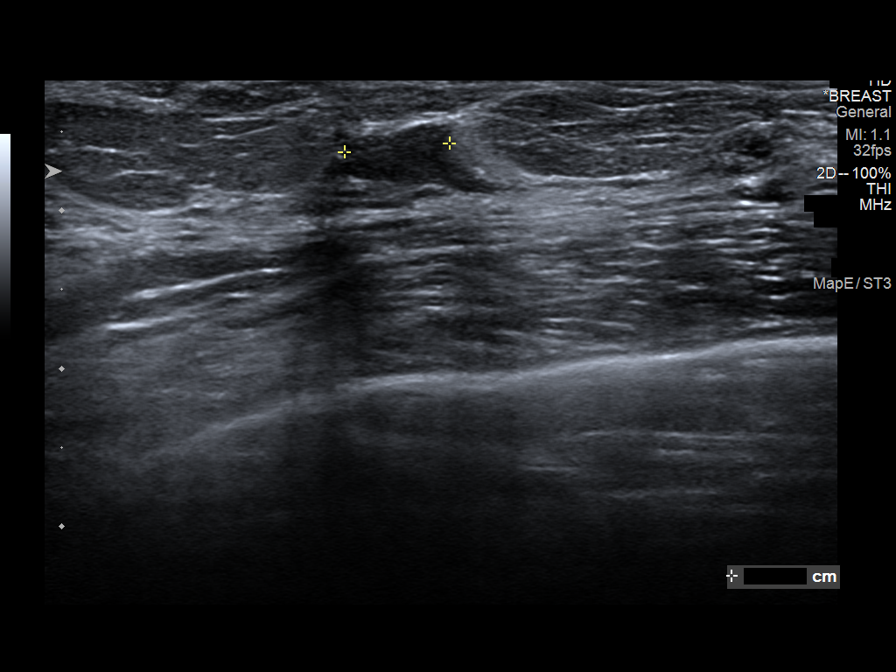
[im 3/9]
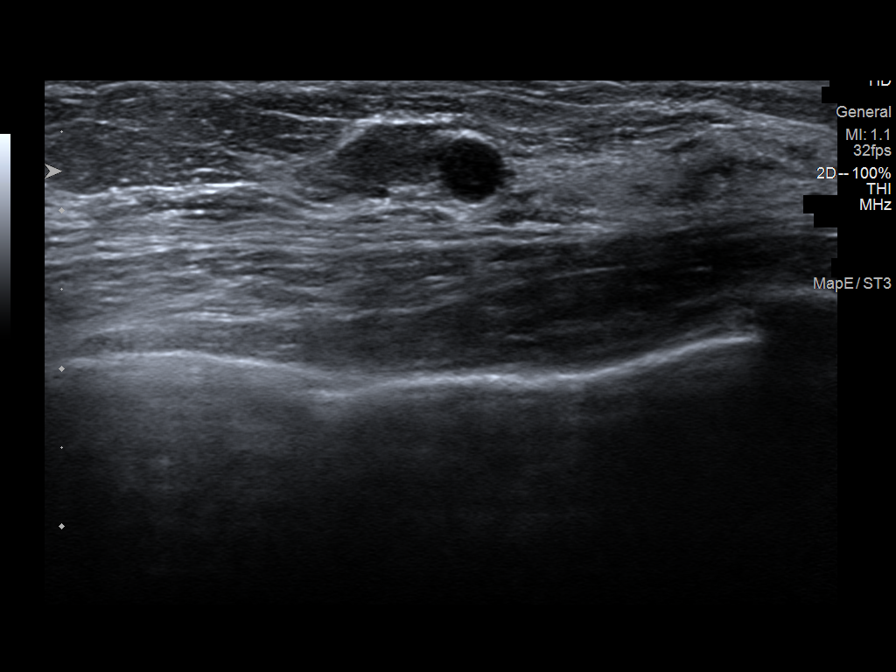
[im 4/9]
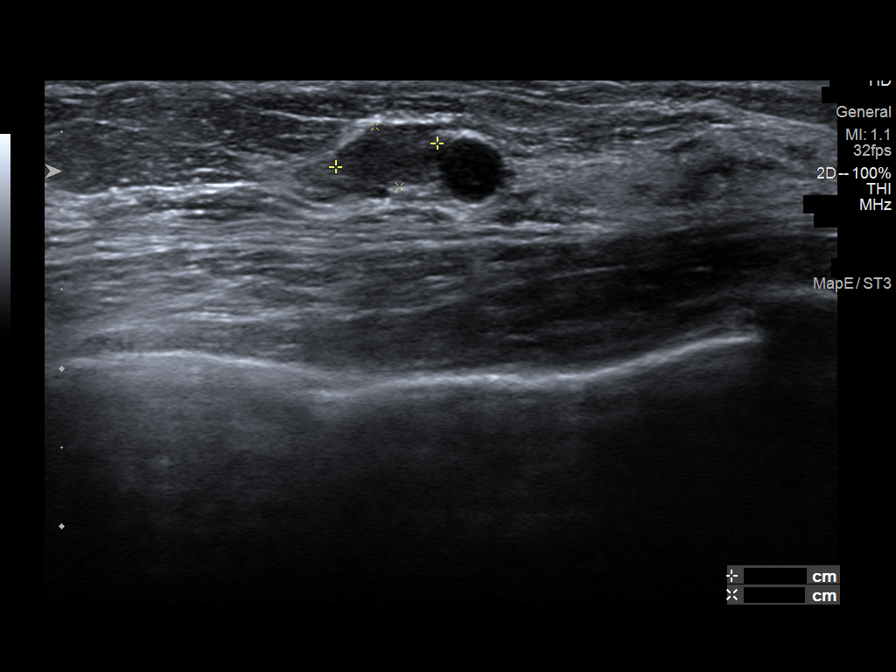
[im 5/9]
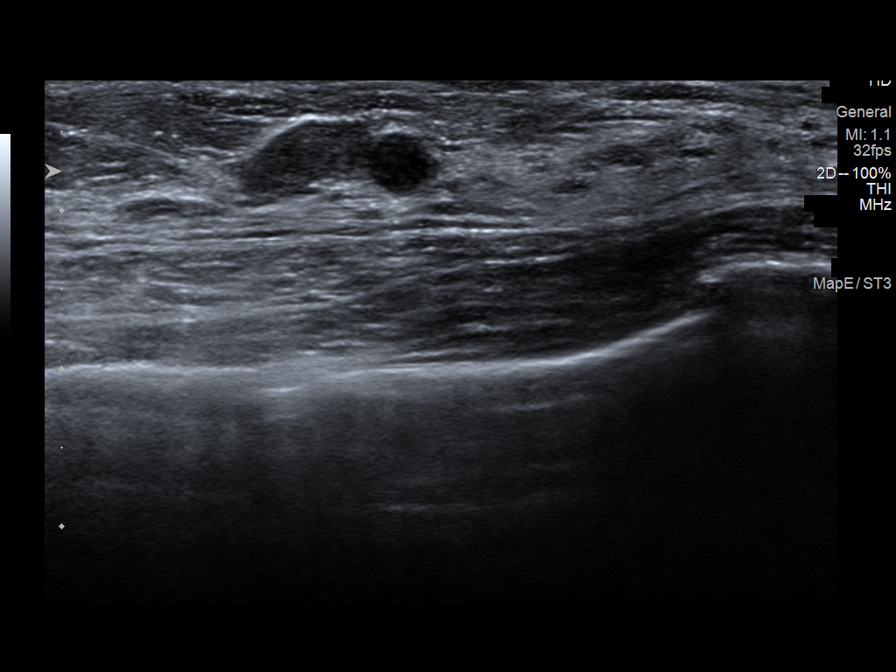
[im 6/9]
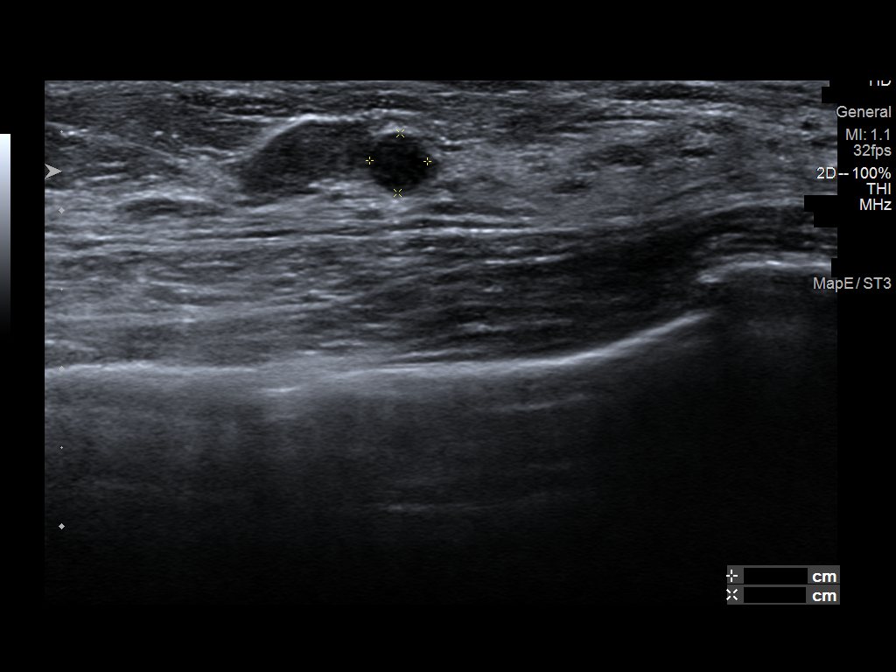
[im 7/9]
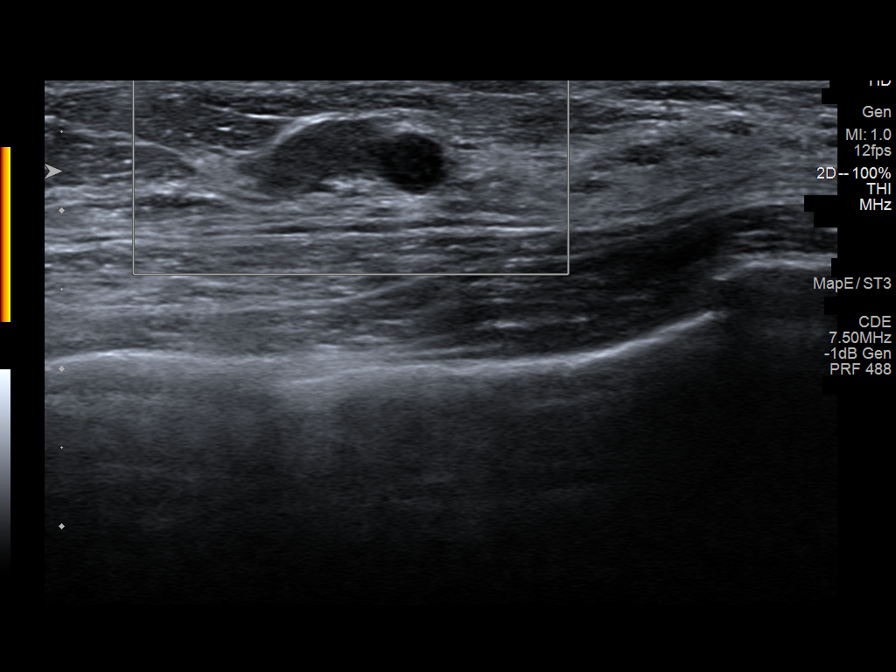
[im 8/9]
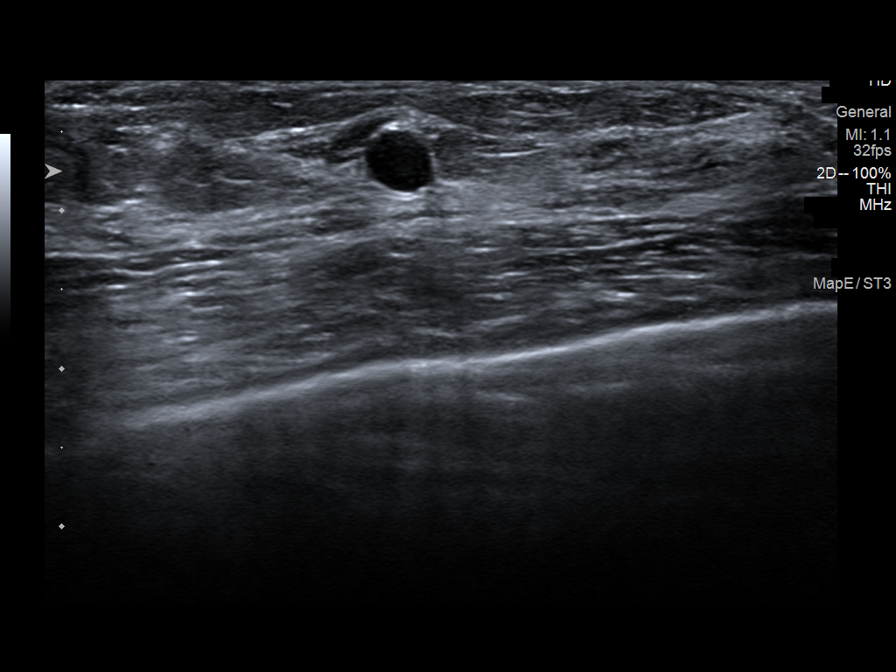
[im 9/9]
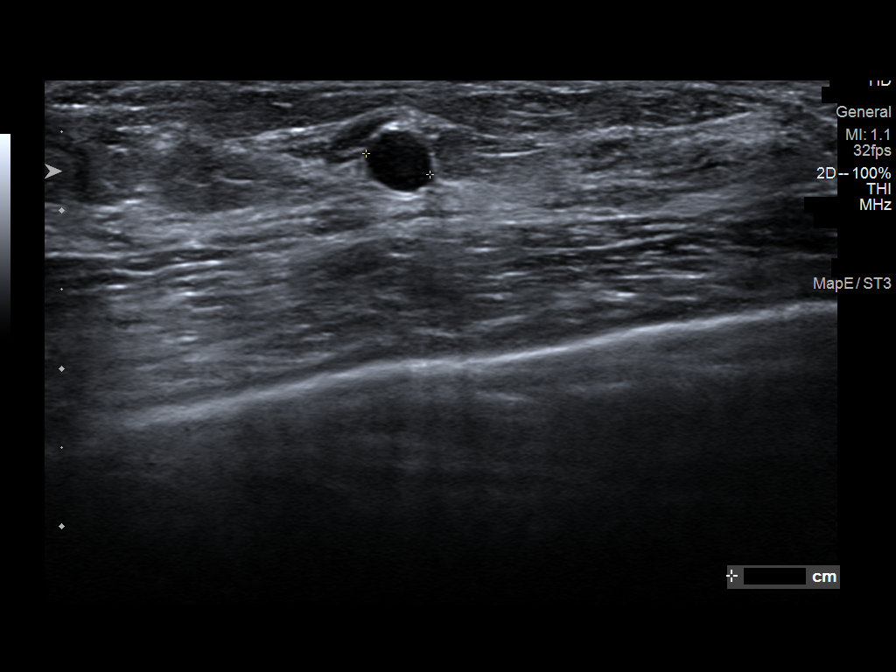

[9 of 9 positions shown; findings below may reference images not displayed]

Additional follow-up for probably benign fibroadenoma in the LEFT
breast at the 1 o'clock axis which was originally identified on
ultrasound of 07/22/2018.

History of biopsy-proven fibroadenoma in the RIGHT breast at the
2:30 o'clock axis.

EXAM:
DIGITAL DIAGNOSTIC BILATERAL MAMMOGRAM WITH CAD AND TOMO

ULTRASOUND BILATERAL BREAST
ACR Breast Density Category c: The breast tissue is heterogeneously
dense, which may obscure small masses.
FINDINGS: There are no new dominant masses, suspicious calcifications or
secondary signs of malignancy within either breast. Biopsy-proven
fibroadenoma again noted within the inner RIGHT breast, at posterior
depth, with associated biopsy site marker.

Mammographic images were processed with CAD.

LEFT breast: Targeted ultrasound is performed, again showing an oval
circumscribed hypoechoic mass in the LEFT breast at the 1 o'clock
axis, 4 cm from the nipple, measuring 0.7 x 0.4 x 0.7 cm, stable,
again most suggestive of a benign fibroadenoma. There has been
redevelopment of a benign cyst immediately adjacent to this probably
benign fibroadenoma.

RIGHT breast: Targeted ultrasound is performed, again showing an
oval circumscribed hypoechoic mass in the RIGHT breast at the 7
o'clock axis, 3 cm from the nipple, measuring 1.1 x 0.4 x 1 cm,
stable, again most suggestive of a benign fibroadenoma.

Again noted is a similar appearing oval circumscribed hypoechoic
mass in the RIGHT breast at the 7 o'clock axis, 3 cm from nipple,
measuring 0.6 x 0.3 x 0.4 cm, stable, again most suggestive of a
benign fibroadenoma.
IMPRESSION: 1. Stable probably benign masses within each breast, as detailed
above, most likely benign fibroadenomas. Recommend additional
bilateral ultrasound in 6 months to ensure continued stability.
2. Biopsy-proven fibroadenoma within the inner RIGHT breast, at
posterior depth, with associated biopsy clip.

RECOMMENDATION:
1. Bilateral ultrasound in 6 months.
2. If findings are again stable bilaterally, would then recommend
additional follow-up ultrasound 1 year after the next ultrasound to
ensure a 2 year (or greater than 2 year) stability for all findings.
This plan was discussed with the patient today.

I have discussed the findings and recommendations with the patient.
If applicable, a reminder letter will be sent to the patient
regarding the next appointment.

BI-RADS CATEGORY  3: Probably benign.

## 2021-09-24 ENCOUNTER — Other Ambulatory Visit: Payer: Self-pay

## 2021-09-24 ENCOUNTER — Ambulatory Visit: Admission: RE | Admit: 2021-09-24 | Payer: BC Managed Care – PPO | Source: Ambulatory Visit

## 2021-09-24 ENCOUNTER — Ambulatory Visit
Admission: RE | Admit: 2021-09-24 | Discharge: 2021-09-24 | Disposition: A | Discharge status: Home / Self Care | Payer: BC Managed Care – PPO | Source: Ambulatory Visit | Attending: Internal Medicine | Admitting: Internal Medicine

## 2021-09-24 ENCOUNTER — Other Ambulatory Visit: Payer: Self-pay | Admitting: Internal Medicine

## 2021-09-24 DIAGNOSIS — R922 Inconclusive mammogram: Secondary | ICD-10-CM | POA: Diagnosis not present

## 2021-09-24 DIAGNOSIS — R928 Other abnormal and inconclusive findings on diagnostic imaging of breast: Secondary | ICD-10-CM

## 2021-10-03 ENCOUNTER — Encounter: Payer: Self-pay | Admitting: Internal Medicine

## 2021-10-10 ENCOUNTER — Encounter: Payer: Self-pay | Admitting: Internal Medicine

## 2021-10-10 ENCOUNTER — Ambulatory Visit (AMBULATORY_SURGERY_CENTER): Payer: BC Managed Care – PPO | Admitting: Internal Medicine

## 2021-10-10 VITALS — BP 146/80 | HR 62 | Temp 97.3°F | Resp 10 | Ht 63.0 in | Wt 123.0 lb

## 2021-10-10 DIAGNOSIS — Z1211 Encounter for screening for malignant neoplasm of colon: Secondary | ICD-10-CM | POA: Diagnosis not present

## 2021-10-10 DIAGNOSIS — K219 Gastro-esophageal reflux disease without esophagitis: Secondary | ICD-10-CM | POA: Diagnosis not present

## 2021-10-10 DIAGNOSIS — K31A0 Gastric intestinal metaplasia, unspecified: Secondary | ICD-10-CM

## 2021-10-10 DIAGNOSIS — K2970 Gastritis, unspecified, without bleeding: Secondary | ICD-10-CM

## 2021-10-10 DIAGNOSIS — K2100 Gastro-esophageal reflux disease with esophagitis, without bleeding: Secondary | ICD-10-CM | POA: Diagnosis not present

## 2021-10-10 DIAGNOSIS — R1011 Right upper quadrant pain: Secondary | ICD-10-CM

## 2021-10-10 DIAGNOSIS — K2950 Unspecified chronic gastritis without bleeding: Secondary | ICD-10-CM | POA: Diagnosis not present

## 2021-10-10 MED ORDER — SODIUM CHLORIDE 0.9 % IV SOLN: 500.0000 mL | Freq: Once | INTRAVENOUS | Status: DC

## 2021-10-10 NOTE — Patient Instructions (Signed)
Handout on diverticulosis given.  Await pathology results.  YOU HAD AN ENDOSCOPIC PROCEDURE TODAY AT [facility]:   Refer to the procedure report that was given to you for any specific questions about what was found during the examination.  If the procedure report does not answer your questions, please call your gastroenterologist to clarify.  If you requested that your care partner not be given the details of your procedure findings, then the procedure report has been included in a sealed envelope for you to review at your convenience later.  YOU SHOULD EXPECT: Some feelings of bloating in the abdomen. Passage of more gas than usual.  Walking can help get rid of the air that was put into your GI tract during the procedure and reduce the bloating. If you had a lower endoscopy (such as a colonoscopy or flexible sigmoidoscopy) you may notice spotting of blood in your stool or on the toilet paper. If you underwent a bowel prep for your procedure, you may not have a normal bowel movement for a few days.  Please Note:  You might notice some irritation and congestion in your nose or some drainage.  This is from the oxygen used during your procedure.  There is no need for concern and it should clear up in a day or so.  SYMPTOMS TO REPORT IMMEDIATELY:  Following lower endoscopy (colonoscopy or flexible sigmoidoscopy):  Excessive amounts of blood in the stool  Significant tenderness or worsening of abdominal pains  Swelling of the abdomen that is new, acute  Fever of 100F or higher  For urgent or emergent issues, a gastroenterologist can be reached at any hour by calling [phone]. Do not use MyChart messaging for urgent concerns.    DIET:  We do recommend a small meal at first, but then you may proceed to your regular diet.  Drink plenty of fluids but you should avoid alcoholic beverages for 24 hours.  ACTIVITY:  You should plan to take it easy for the rest of today and you  should NOT DRIVE or use heavy machinery until tomorrow (because of the sedation medicines used during the test).    FOLLOW UP: Our staff will call the number listed on your records 48-72 hours following your procedure to check on you and address any questions or concerns that you may have regarding the information given to you following your procedure. If we do not reach you, we will leave a message.  We will attempt to reach you two times.  During this call, we will ask if you have developed any symptoms of COVID 19. If you develop any symptoms (ie: fever, flu-like symptoms, shortness of breath, cough etc.) before then, please call [phone].  If you test positive for Covid 19 in the 2 weeks post procedure, please call and report this information to [place].    If any biopsies were taken you will be contacted by phone or by letter within the next 1-3 weeks.  Please call us at [phone] if you have not heard about the biopsies in 3 weeks.    SIGNATURES/CONFIDENTIALITY: You and/or your care partner have signed paperwork which will be entered into your electronic medical record.  These signatures attest to the fact that that the information above on your After Visit Summary has been reviewed and is understood.  Full responsibility of the confidentiality of this discharge information lies with you and/or your care-partner.

## 2021-10-10 NOTE — Progress Notes (Signed)
To pacu, VSS. Report to [email or web address] 

## 2021-10-10 NOTE — Progress Notes (Signed)
Pt's states no medical or surgical changes since previsit or office visit. 

## 2021-10-10 NOTE — Op Note (Addendum)
[facility] Patient Name: Dana Pace Procedure Date: 10/10/2021 3:08 PM MRN: [id number] Endoscopist: Sonny Masters "[name] ,  Age: 51 Referring MD:  Date of Birth: 1971-08-21 Gender: Female Account #: [id number] Procedure:                Colonoscopy Indications:              Screening for colorectal malignant neoplasm Medicines:                Monitored Anesthesia Care Procedure:                Pre-Anesthesia Assessment:                           - Prior to the procedure, a History and Physical                            was performed, and patient medications and                            allergies were reviewed. The patient's tolerance of                            previous anesthesia was also reviewed. The risks                            and benefits of the procedure and the sedation                            options and risks were discussed with the patient.                            All questions were answered, and informed consent                            was obtained. Prior Anticoagulants: The patient has                            taken no previous anticoagulant or antiplatelet                            agents. ASA Grade Assessment: II - A patient with                            mild systemic disease. After reviewing the risks                            and benefits, the patient was deemed in                            satisfactory condition to undergo the procedure.                           After obtaining informed consent, the colonoscope  was passed under direct vision. Throughout the                            procedure, the patient's blood pressure, pulse, and                            oxygen saturations were monitored continuously. The                            Olympus [id number]-[id number] (#[id number]) Colonoscope was                            introduced through the anus and advanced to the the                            cecum,  identified by appendiceal orifice and                            ileocecal valve. The colonoscopy was performed                            without difficulty. The patient tolerated the                            procedure well. The quality of the bowel                            preparation was good. The terminal ileum, ileocecal                            valve, appendiceal orifice, and rectum were                            photographed. Scope In: 3:22:13 PM Scope Out: 3:33:14 PM Scope Withdrawal Time: 0 hours 7 minutes 9 seconds  Total Procedure Duration: 0 hours 11 minutes 1 second  Findings:                 A few diverticula were found in the sigmoid colon.                           Non-bleeding internal hemorrhoids were found during                            retroflexion. Complications:            No immediate complications. Estimated Blood Loss:     Estimated blood loss: none. Impression:               - Diverticulosis in the sigmoid colon.                           - Non-bleeding internal hemorrhoids.                           - No specimens collected. Recommendation:           -  Discharge patient to home (with escort).                           - Repeat colonoscopy in 10 years for screening                            purposes.                           - Return to GI clinic in 1 month.                           - The findings and recommendations were discussed                            with the patient. Sonny Masters "[name],  10/10/2021 3:45:22 PM

## 2021-10-10 NOTE — Progress Notes (Signed)
Called to room to assist during endoscopic procedure.  Patient ID and intended procedure confirmed with present staff. Received instructions for my participation in the procedure from the performing physician.  

## 2021-10-10 NOTE — Progress Notes (Signed)
GASTROENTEROLOGY PROCEDURE H&P NOTE   Primary Care Physician: Hoyt Koch, MD    Reason for Procedure:   RUQ ab pain, colon cancer screening  Plan:    EGD/colonoscopy  Patient is appropriate for endoscopic procedure(s) in the ambulatory ([facility]) setting.  The nature of the procedure, as well as the risks, benefits, and alternatives were carefully and thoroughly reviewed with the patient. Ample time for discussion and questions allowed. The patient understood, was satisfied, and agreed to proceed.     HPI: Dana Pace is a 51 y.o. female who presents for EGD/colonoscopy for evaluation of RUQ ab pain and colon cancer screening .  Patient was most recently seen in the Gastroenterology Clinic on 09/14/21.  No interval change in medical history since that appointment. Please refer to that note for full details regarding GI history and clinical presentation.   Past Medical History:  Diagnosis Date   Anxiety    Blood transfusion without reported diagnosis    Hx of migraine headaches    Hypertension    controlled   Thyroid disease     Past Surgical History:  Procedure Laterality Date   ABDOMINAL HYSTERECTOMY     BREAST BIOPSY Right 07/09/2017   benign   DILATION AND CURETTAGE OF UTERUS     x2   WISDOM TOOTH EXTRACTION      Prior to Admission medications   Medication Sig Start Date End Date Taking? Authorizing Provider  cholecalciferol (VITAMIN D3) 25 MCG (1000 UNIT) tablet Take 1,000 Units by mouth daily.   Yes [provider]  escitalopram (LEXAPRO) 20 MG tablet Take 20 mg by mouth daily. 06/07/21  Yes [provider]  levothyroxine (SYNTHROID) 112 MCG tablet Take 1 tablet (112 mcg total) by mouth daily before breakfast. 01/08/21  Yes Hoyt Koch, MD  ondansetron (ZOFRAN) 4 MG tablet Take 1 tablet (4 mg total) by mouth every 8 (eight) hours as needed for nausea or vomiting. 06/28/21  Yes Hoyt Koch, MD  cyclobenzaprine  (FLEXERIL) 5 MG tablet Take 1 tablet (5 mg total) by mouth at bedtime as needed for muscle spasms. Patient not taking: Reported on 10/10/2021 08/04/18   Marrian Salvage, FNP  LORazepam (ATIVAN) 0.5 MG tablet Take 0.5 mg by mouth daily as needed. Patient not taking: Reported on 10/10/2021 02/02/21   [provider]    Current Outpatient Medications  Medication Sig Dispense Refill   cholecalciferol (VITAMIN D3) 25 MCG (1000 UNIT) tablet Take 1,000 Units by mouth daily.     escitalopram (LEXAPRO) 20 MG tablet Take 20 mg by mouth daily.     levothyroxine (SYNTHROID) 112 MCG tablet Take 1 tablet (112 mcg total) by mouth daily before breakfast. 90 tablet 3   ondansetron (ZOFRAN) 4 MG tablet Take 1 tablet (4 mg total) by mouth every 8 (eight) hours as needed for nausea or vomiting. 20 tablet 0   cyclobenzaprine (FLEXERIL) 5 MG tablet Take 1 tablet (5 mg total) by mouth at bedtime as needed for muscle spasms. (Patient not taking: Reported on 10/10/2021) 30 tablet 1   LORazepam (ATIVAN) 0.5 MG tablet Take 0.5 mg by mouth daily as needed. (Patient not taking: Reported on 10/10/2021)     Current Facility-Administered Medications  Medication Dose Route Frequency Provider Last Rate Last Admin   0.9 %  sodium chloride infusion  500 mL Intravenous Once Sharyn Creamer, MD        Allergies as of 10/10/2021 - Review Complete 10/10/2021  Allergen  Reaction Noted   Augmentin [amoxicillin-pot clavulanate]  08/04/2018   Prednisone Other (See Comments) 11/15/2019    Family History  Problem Relation Age of Onset   Mental illness Father    Mental illness Paternal Uncle    Alcohol abuse Paternal Uncle    Cancer Maternal Grandmother    Hyperlipidemia Maternal Grandmother    Hypertension Maternal Grandmother    Diabetes Maternal Grandmother    Cancer Maternal Grandfather    Stomach cancer Paternal Grandmother    Breast cancer Neg Hx    Colon polyps Neg Hx    Colon cancer Neg Hx    Esophageal cancer  Neg Hx    Rectal cancer Neg Hx     Social History   Socioeconomic History   Marital status: Married    Spouse name: Not on file   Number of children: Not on file   Years of education: Not on file   Highest education level: Not on file  Occupational History   Not on file  Tobacco Use   Smoking status: Former   Smokeless tobacco: Never  Vaping Use   Vaping Use: Never used  Substance and Sexual Activity   Alcohol use: Yes    Alcohol/week: 2.0 standard drinks    Types: 2 Standard drinks or equivalent per week   Drug use: No   Sexual activity: Not on file  Other Topics Concern   Not on file  Social History Narrative   Not on file   Social Determinants of Health   Financial Resource Strain: Not on file  Food Insecurity: Not on file  Transportation Needs: Not on file  Physical Activity: Not on file  Stress: Not on file  Social Connections: Not on file  Intimate Partner Violence: Not on file    Physical Exam: Vital signs in last 24 hours: BP (!) 137/91    Pulse 66    Temp (!) 97.3 F (36.3 C) (Skin)    Ht 5\' 3"  (1.6 m)    Wt 123 lb (55.8 kg)    SpO2 98%    BMI 21.79 kg/m  GEN: NAD EYE: Sclerae anicteric ENT: MMM CV: Non-tachycardic Pulm: No increased WOB GI: Soft NEURO:  Alert & Oriented   [name], MD [facility]   10/10/2021 3:02 PM

## 2021-10-10 NOTE — Op Note (Signed)
[name] Procedure Date: 10/10/2021 3:08 PM MRN: [id number] Endoscopist: Sonny Masters "[name] ,  Age: 51 Referring MD:  Date of Birth: 1970-09-11 Gender: Female Account #: [id number] Procedure:                Upper GI endoscopy Indications:              Abdominal pain in the right upper quadrant Medicines:                Monitored Anesthesia Care Procedure:                Pre-Anesthesia Assessment:                           - Prior to the procedure, a History and Physical                            was performed, and patient medications and                            allergies were reviewed. The patient's tolerance of                            previous anesthesia was also reviewed. The risks                            and benefits of the procedure and the sedation                            options and risks were discussed with the patient.                            All questions were answered, and informed consent                            was obtained. Prior Anticoagulants: The patient has                            taken no previous anticoagulant or antiplatelet                            agents. ASA Grade Assessment: II - A patient with                            mild systemic disease. After reviewing the risks                            and benefits, the patient was deemed in                            satisfactory condition to undergo the procedure.                           After obtaining informed consent, the endoscope was  passed under direct vision. Throughout the                            procedure, the patient's blood pressure, pulse, and                            oxygen saturations were monitored continuously. The                            GIF [id number] #[id number] was introduced through the                            mouth, and advanced to the second part of duodenum.                            The upper  GI endoscopy was accomplished without                            difficulty. The patient tolerated the procedure                            well. Scope In: Scope Out: Findings:                 White nummular lesions were noted in the lower                            third of the esophagus. Biopsies were taken with a                            cold forceps for histology.                           Localized erythematous mucosa without bleeding was                            found in the gastric antrum. Biopsies were taken                            with a cold forceps for histology.                           The examined duodenum was normal. Biopsies were                            taken with a cold forceps for histology. Complications:            No immediate complications. Estimated Blood Loss:     Estimated blood loss was minimal. Impression:               - White nummular lesions in esophageal mucosa.                            Biopsied.                           -  Erythematous mucosa in the antrum. Biopsied.                           - Normal examined duodenum. Biopsied. Recommendation:           - Await pathology results.                           - Perform a colonoscopy today. Sonny Masters "[name],  10/10/2021 3:40:56 PM

## 2021-10-12 ENCOUNTER — Telehealth: Payer: Self-pay

## 2021-10-12 NOTE — Telephone Encounter (Signed)
°  Follow up Call-  Call back number 10/10/2021  Post procedure Call Back phone  # [phone]  Permission to leave phone message Yes  Some recent data might be hidden     Patient questions:  Do you have a fever, pain , or abdominal swelling? No. Pain Score  0 *  Have you tolerated food without any problems? Yes.    Have you been able to return to your normal activities? Yes.    Do you have any questions about your discharge instructions: Diet   No. Medications  No. Follow up visit  No.  Do you have questions or concerns about your Care? No.  Actions: * If pain score is 4 or above: No action needed, pain <4.

## 2021-10-26 ENCOUNTER — Encounter: Payer: Self-pay | Admitting: Internal Medicine

## 2021-11-05 DIAGNOSIS — Z20822 Contact with and (suspected) exposure to covid-19: Secondary | ICD-10-CM | POA: Diagnosis not present

## 2021-11-08 ENCOUNTER — Ambulatory Visit: Payer: BC Managed Care – PPO | Admitting: Internal Medicine

## 2021-11-28 ENCOUNTER — Ambulatory Visit: Payer: BC Managed Care – PPO | Admitting: Internal Medicine

## 2021-12-06 DIAGNOSIS — F411 Generalized anxiety disorder: Secondary | ICD-10-CM | POA: Diagnosis not present

## 2021-12-14 ENCOUNTER — Ambulatory Visit: Payer: BC Managed Care – PPO | Admitting: Internal Medicine

## 2021-12-14 ENCOUNTER — Encounter: Payer: Self-pay | Admitting: Internal Medicine

## 2021-12-14 VITALS — BP 110/80 | HR 58 | Ht 63.0 in | Wt 125.0 lb

## 2021-12-14 DIAGNOSIS — K219 Gastro-esophageal reflux disease without esophagitis: Secondary | ICD-10-CM

## 2021-12-14 DIAGNOSIS — K31A0 Gastric intestinal metaplasia, unspecified: Secondary | ICD-10-CM | POA: Diagnosis not present

## 2021-12-14 DIAGNOSIS — R1011 Right upper quadrant pain: Secondary | ICD-10-CM | POA: Diagnosis not present

## 2021-12-14 DIAGNOSIS — K2970 Gastritis, unspecified, without bleeding: Secondary | ICD-10-CM | POA: Diagnosis not present

## 2021-12-14 DIAGNOSIS — K7689 Other specified diseases of liver: Secondary | ICD-10-CM

## 2021-12-14 DIAGNOSIS — K2990 Gastroduodenitis, unspecified, without bleeding: Secondary | ICD-10-CM

## 2021-12-14 NOTE — Patient Instructions (Signed)
If you are age 49 or older, your body mass index should be between 23-30. Your Body mass index is 22.14 kg/m?Marland Kitchen If this is out of the aforementioned range listed, please consider follow up with your Primary Care Provider. ? ?If you are age 57 or younger, your body mass index should be between 19-25. Your Body mass index is 22.14 kg/m?Marland Kitchen If this is out of the aformentioned range listed, please consider follow up with your Primary Care Provider.  ? ?Drink 8 cups of water daily and walk 30 minutes daily. ? ?Start daily fiber supplement like Benefiber. ? ?Start Miralax daily. ? ?We will send a referral to surgery and they will contact you to make an appointment. ? ?The [facility] providers would like to encourage you to use [facility] to communicate with providers for non-urgent requests or questions.  Due to long hold times on the telephone, sending your provider a message by [facility] may be a faster and more efficient way to get a response.  Please allow 48 business hours for a response.  Please remember that this is for non-urgent requests.  ? ?It was a pleasure to see you today! ? ?Thank you for trusting me with your gastrointestinal care!   ? ? ? ?

## 2021-12-14 NOTE — Progress Notes (Signed)
? ?Chief Complaint: Hepatic cyst, RUQ ab pain ? ?HPI : 51 year old female with history of anxiety, migraines, hypothyroidism, and HTN presents for follow up of RUQ ab pain ? ?Interval History: Despite being started on daily omeprazole therapy, she has not had any improvement in her right upper quadrant abdominal pain.  However she did note that the omeprazole seem to help with her acid reflux.  She has continued to have intermittent right upper quadrant abdominal pain that feels like a dull ache.  The pain gets worse after she bends over.  After she underwent the colonoscopy procedure, she did have about 3 weeks of relief from the abdominal pain. Afterwards, the pain recurred.  Patient's grandmother may have had stomach cancer. ? ?Current Outpatient Medications  ?Medication Sig Dispense Refill  ? cholecalciferol (VITAMIN D3) 25 MCG (1000 UNIT) tablet Take 1,000 Units by mouth daily.    ? cyclobenzaprine (FLEXERIL) 5 MG tablet Take 1 tablet (5 mg total) by mouth at bedtime as needed for muscle spasms. 30 tablet 1  ? escitalopram (LEXAPRO) 20 MG tablet Take 20 mg by mouth daily.    ? levothyroxine (SYNTHROID) 112 MCG tablet Take 1 tablet (112 mcg total) by mouth daily before breakfast. 90 tablet 3  ? LORazepam (ATIVAN) 0.5 MG tablet Take 0.5 mg by mouth daily as needed.    ? ondansetron (ZOFRAN) 4 MG tablet Take 1 tablet (4 mg total) by mouth every 8 (eight) hours as needed for nausea or vomiting. 20 tablet 0  ? ?Current Facility-Administered Medications  ?Medication Dose Route Frequency Provider Last Rate Last Admin  ? 0.9 %  sodium chloride infusion  500 mL Intravenous Once Sharyn Creamer, MD      ? ?Review of Systems: ?All systems reviewed and negative except where noted in HPI.  ? ?Physical Exam: ?BP 110/80   Pulse (!) 58   Ht '5\' 3"'$  (1.6 m)   Wt 125 lb (56.7 kg)   BMI 22.14 kg/m?  ?Constitutional: Pleasant,well-developed, female in no acute distress. ?HEENT: Normocephalic and atraumatic. Conjunctivae are  normal. No scleral icterus. ?Cardiovascular: Normal rate, regular rhythm.  ?Pulmonary/chest: Effort normal and breath sounds normal. No wheezing, rales or rhonchi. ?Abdominal: Soft, nondistended, tender in the RUQ and epigastric area. Bowel sounds slightly quiet. ?Extremities: No edema ?Neurological: Alert and oriented to person place and time. ?Skin: Skin is warm and dry. No rashes noted. ?Psychiatric: Normal mood and affect. Behavior is normal. ? ?Labs 06/2021: CBC and CMP unremarkable. Lipase nml. ? ?Labs 08/2021: TSH nml ? ?RUQ U/S 06/28/21: ?IMPRESSION: ?Massive enlargement of multiple hepatic cysts in comparison to prior ultrasound in 2006, measuring up to 13.5 cm in right hepatic lobe and 12.0 cm in left hepatic lobe, with complex appearance. This could potentially be a source of abdominal pain and can be further evaluated with nonemergent outpatient MRI with and without contrast. ?Normal appearance of the gallbladder. ? ?MR Abd 07/18/21: ?IMPRESSION: ?Numerous bilobar hepatic cysts measuring up to 13.7 cm. No ?suspicious hepatic lesion. ? ?EGD 10/10/21: ?- White nummular lesions in esophageal mucosa. Biopsied. ?- Erythematous mucosa in the antrum. Biopsied. ?- Normal examined duodenum. Biopsied. ?Path: ?1. Surgical [P], 2nd portion of duodenum ?BENIGN DUODENAL MUCOSA WITH NO DIAGNOSTIC ABNORMALITY ?2. Surgical [P], gastric antrum and gastric body ?REACTIVE GASTROPATHY AND MILD CHRONIC GASTRITIS WITH LYMPHOID AGGREGATE ?FOCAL INTESTINAL METAPLASIA ?HELICOBACTER STAIN NEGATIVE (IHC, ADEQUATE CONTROL) ?NEGATIVE FOR DYSPLASIA AND CARCINOMA ?3. Surgical [P], mid esophagus and distal esophagus ?REFLUX ESOPHAGITIS (3 EOS/HIGH POWER FIELD) ? ?  Colonoscopy 10/10/21: ?- A few diverticula were found in the sigmoid colon. ?- Non-bleeding internal hemorrhoids were found during retroflexion. ? ?ASSESSMENT AND PLAN: ? ?Liver cysts ?RUQ ab pain ?Gastric intestinal metaplasia ?Gastritis ?GERD ?Patient presents for follow-up of  her right upper quadrant abdominal pain that started in 06/2021.  Patient has not had any improvement in her abdominal pain symptoms despite being on daily PPI therapy.  At this time it is possible that her abdominal pain could be due to the benign liver cysts that were seen on her abdominal imaging.  Thus we will plan to go ahead and refer to general surgery for potential surgical resection cysts.  In the meantime we will continue to work on improving her abdominal pain symptoms by instituting constipation therapies.  Patient did note that after her colonoscopy procedure, she had improvement in her abdominal pain for about 3 weeks.  This could suggest that underlying stool burden may be contributing to her abdominal pain as well.  Patient was noted on her last EGD to have gastric intestinal metaplasia.  Thus we will plan to perform gastric mapping in 1 year to see if the patient will need endoscopic surveillance in the future. ?- Continue omeprazole 20 mg QHS (she avoids taking it in the daytime to avoid interfering with her thyroid medication) ?- Drink 8 cups of water per day ?- Start daily fiber supplement ?- Continue to walk 30 minutes per day ?- Start Miralax QD ?- Patient will continue Zofran ?- Recall for repeat EGD in 10/2022 ?- Referral to general surgery for consideration surgical options for treating liver cysts ?- RTC in 2 months ? ?[name], MD ? ?I spent 42 minutes of time, including in depth chart review, independent review of results as outlined above, communicating results with the patient directly, face-to-face time with the patient, coordinating care, ordering studies and medications as appropriate, and documentation. ? ?

## 2021-12-31 ENCOUNTER — Other Ambulatory Visit: Payer: Self-pay | Admitting: Internal Medicine

## 2022-01-09 ENCOUNTER — Encounter: Payer: Self-pay | Admitting: Internal Medicine

## 2022-02-05 ENCOUNTER — Ambulatory Visit: Payer: Self-pay | Admitting: Surgery

## 2022-02-05 DIAGNOSIS — K7689 Other specified diseases of liver: Secondary | ICD-10-CM | POA: Diagnosis not present

## 2022-02-05 NOTE — H&P (Signed)
History of Present Illness: Dana Pace is a 51 y.o. female who was referred to me for evaluation of hepatic cysts. She was evaluated by her PCP in October 2022 for RUQ abdominal pain, and was referred for an ultrasound. This did not show any gallstones but did show significant enlargement of multiple hepatic cysts compared to a prior US in 2006. She then had an MRI in November 2022 for further characterization, which showed multiple cysts, the largest of which was in the right liver and measured about 13cm. She was referred to GI and saw Dr. Lorenso Courier for workup of her abdominal pain in January 2023 and was started on a PPI. She had an EGD and colonoscopy on 10/10/21, which showed only mild gastritis and diverticulosis. She has continued to have pain despite PPI therapy, and has been referred to discuss surgical treatment of her liver cysts.     Today she says she has RUQ pain radiating to the flank and back, which is almost constant. It gets worse with certain movements. She denies nausea and early satiety. She also endorses chronic constipation. Her only prior abdominal surgery is a hysterectomy.     Review of Systems: A complete review of systems was obtained from the patient.  I have reviewed this information and discussed as appropriate with the patient.  See HPI as well for other ROS.       Medical History: Past Medical History Past Medical History: Diagnosis Date  Anxiety    Liver disease    Migraine    Thyroid disease        Patient Active Problem List Diagnosis  Hepatic cyst  Right upper quadrant abdominal pain     Past Surgical History Past Surgical History: Procedure Laterality Date  HYSTERECTOMY          Allergies Allergies Allergen Reactions  Amoxicillin-Pot Clavulanate Other (See Comments)     Diarrhea/ can take PCN  Prednisone Other (See Comments)     Numbness to face      Current Outpatient Medications on File Prior to  Visit Medication Sig Dispense Refill  levothyroxine (SYNTHROID) 112 MCG tablet levothyroxine 112 mcg tablet      ondansetron (ZOFRAN) 4 MG tablet Take 4 mg by mouth every 8 (eight) hours as needed      cyanocobalamin (VITAMIN B12) 1,000 mcg/mL injection Inject into the muscle monthly      ergocalciferol, vitamin D2, 1,250 mcg (50,000 unit) capsule Take 50,000 Units by mouth once a week      escitalopram oxalate (LEXAPRO) 20 MG tablet Take 20 mg by mouth once daily      LORazepam (ATIVAN) 0.5 MG tablet Take 0.5 mg by mouth every 8 (eight) hours as needed for Anxiety       No current facility-administered medications on file prior to visit.     Family History Family History Problem Relation Age of Onset  High blood pressure (Hypertension) Sister    Hyperlipidemia (Elevated cholesterol) Sister        Social History   Tobacco Use Smoking Status Former  Types: Cigarettes Smokeless Tobacco Never     Social History Social History    Socioeconomic History  Marital status: Married Tobacco Use  Smoking status: Former     Types: Cigarettes  Smokeless tobacco: Never Substance and Sexual Activity  Alcohol use: Yes  Drug use: Never      Objective:     Vitals:   02/05/22 1349 BP: 118/80 Pulse: 78 Temp: 36.6 C (97.9  F) SpO2: 98% Weight: 56.5 kg (124 lb 9.6 oz) Height: 160 cm ('5\' 3"'$ )   Body mass index is 22.07 kg/m.   Physical Exam Vitals reviewed.  Constitutional:      General: She is not in acute distress.    Appearance: Normal appearance.  Eyes:     General: No scleral icterus.    Conjunctiva/sclera: Conjunctivae normal.  Cardiovascular:     Rate and Rhythm: Normal rate and regular rhythm.     Heart sounds: No murmur heard. Pulmonary:     Effort: Pulmonary effort is normal. No respiratory distress.     Breath sounds: Normal breath sounds. No wheezing.  Abdominal:     General: There is no distension.     Palpations: Abdomen is soft.     Comments: Mildly  tender in RUQ and right flank.  Musculoskeletal:        General: No swelling or deformity. Normal range of motion.  Skin:    General: Skin is warm and dry.     Coloration: Skin is not jaundiced.  Neurological:     General: No focal deficit present.     Mental Status: She is alert and oriented to person, place, and time.  Psychiatric:        Mood and Affect: Mood normal.        Behavior: Behavior normal.        Thought Content: Thought content normal.          Labs, Imaging and Diagnostic Testing: MRI abdomen 07/17/21: Hepatobiliary: Numerous bilobar hepatic cysts some of which demonstrate thin internal septations but none of which demonstrate suspicious wall/septal thickening or postcontrast enhancement. For instance in the superior aspect of the right lobe of the liver there is a 13.7 x 8.6 cm cyst on image 8/4 and in the left lobe of the liver there is a 9.1 x 7.7 cm cyst on image 11/4. No suspicious hepatic lesion. Gallbladder is unremarkable. No biliary ductal dilation.   Assessment and Plan: Diagnoses and all orders for this visit:   Hepatic cyst     This is a 50 yo female referred for evaluation of hepatic cysts. I personally reviewed her MRI, which shows a large dominant cyst in the right liver measuring almost 14cm, as well as large cyst adjacent to the left lateral lobe measuring 9cm. There are also multiple much smaller cysts. All of these appear to be simple and benign in nature. Her abdominal pain has been thoroughly evaluated and is likely related to the cysts.  I recommended laparoscopic fenestration with ablation of the lining of the cysts.  This can be scheduled electively with overnight observation in the hospital.  I reviewed the details of this procedure and the risk of cyst recurrence and the patient agrees to proceed with surgery.  She will be contacted to schedule an elective surgery date.  Michaelle Birks, [facility], Hepatobiliary  and Pancreatic Surgery 02/05/22 2:30 PM

## 2022-02-05 NOTE — H&P (View-Only) (Signed)
History of Present Illness: Dana Pace is a 51 y.o. female who was referred to me for evaluation of hepatic cysts. She was evaluated by her PCP in October 2022 for RUQ abdominal pain, and was referred for an ultrasound. This did not show any gallstones but did show significant enlargement of multiple hepatic cysts compared to a prior US in 2006. She then had an MRI in November 2022 for further characterization, which showed multiple cysts, the largest of which was in the right liver and measured about 13cm. She was referred to GI and saw Dr. Lorenso Courier for workup of her abdominal pain in January 2023 and was started on a PPI. She had an EGD and colonoscopy on 10/10/21, which showed only mild gastritis and diverticulosis. She has continued to have pain despite PPI therapy, and has been referred to discuss surgical treatment of her liver cysts.     Today she says she has RUQ pain radiating to the flank and back, which is almost constant. It gets worse with certain movements. She denies nausea and early satiety. She also endorses chronic constipation. Her only prior abdominal surgery is a hysterectomy.     Review of Systems: A complete review of systems was obtained from the patient.  I have reviewed this information and discussed as appropriate with the patient.  See HPI as well for other ROS.       Medical History: Past Medical History Past Medical History: Diagnosis Date  Anxiety    Liver disease    Migraine    Thyroid disease        Patient Active Problem List Diagnosis  Hepatic cyst  Right upper quadrant abdominal pain     Past Surgical History Past Surgical History: Procedure Laterality Date  HYSTERECTOMY          Allergies Allergies Allergen Reactions  Amoxicillin-Pot Clavulanate Other (See Comments)     Diarrhea/ can take PCN  Prednisone Other (See Comments)     Numbness to face      Current Outpatient Medications on File Prior to  Visit Medication Sig Dispense Refill  levothyroxine (SYNTHROID) 112 MCG tablet levothyroxine 112 mcg tablet      ondansetron (ZOFRAN) 4 MG tablet Take 4 mg by mouth every 8 (eight) hours as needed      cyanocobalamin (VITAMIN B12) 1,000 mcg/mL injection Inject into the muscle monthly      ergocalciferol, vitamin D2, 1,250 mcg (50,000 unit) capsule Take 50,000 Units by mouth once a week      escitalopram oxalate (LEXAPRO) 20 MG tablet Take 20 mg by mouth once daily      LORazepam (ATIVAN) 0.5 MG tablet Take 0.5 mg by mouth every 8 (eight) hours as needed for Anxiety       No current facility-administered medications on file prior to visit.     Family History Family History Problem Relation Age of Onset  High blood pressure (Hypertension) Sister    Hyperlipidemia (Elevated cholesterol) Sister        Social History   Tobacco Use Smoking Status Former  Types: Cigarettes Smokeless Tobacco Never     Social History Social History    Socioeconomic History  Marital status: Married Tobacco Use  Smoking status: Former     Types: Cigarettes  Smokeless tobacco: Never Substance and Sexual Activity  Alcohol use: Yes  Drug use: Never      Objective:     Vitals:   02/05/22 1349 BP: 118/80 Pulse: 78 Temp: 36.6 C (97.9  F) SpO2: 98% Weight: 56.5 kg (124 lb 9.6 oz) Height: 160 cm ('5\' 3"'$ )   Body mass index is 22.07 kg/m.   Physical Exam Vitals reviewed.  Constitutional:      General: She is not in acute distress.    Appearance: Normal appearance.  Eyes:     General: No scleral icterus.    Conjunctiva/sclera: Conjunctivae normal.  Cardiovascular:     Rate and Rhythm: Normal rate and regular rhythm.     Heart sounds: No murmur heard. Pulmonary:     Effort: Pulmonary effort is normal. No respiratory distress.     Breath sounds: Normal breath sounds. No wheezing.  Abdominal:     General: There is no distension.     Palpations: Abdomen is soft.     Comments: Mildly  tender in RUQ and right flank.  Musculoskeletal:        General: No swelling or deformity. Normal range of motion.  Skin:    General: Skin is warm and dry.     Coloration: Skin is not jaundiced.  Neurological:     General: No focal deficit present.     Mental Status: She is alert and oriented to person, place, and time.  Psychiatric:        Mood and Affect: Mood normal.        Behavior: Behavior normal.        Thought Content: Thought content normal.          Labs, Imaging and Diagnostic Testing: MRI abdomen 07/17/21: Hepatobiliary: Numerous bilobar hepatic cysts some of which demonstrate thin internal septations but none of which demonstrate suspicious wall/septal thickening or postcontrast enhancement. For instance in the superior aspect of the right lobe of the liver there is a 13.7 x 8.6 cm cyst on image 8/4 and in the left lobe of the liver there is a 9.1 x 7.7 cm cyst on image 11/4. No suspicious hepatic lesion. Gallbladder is unremarkable. No biliary ductal dilation.   Assessment and Plan: Diagnoses and all orders for this visit:   Hepatic cyst     This is a 51 yo female referred for evaluation of hepatic cysts. I personally reviewed her MRI, which shows a large dominant cyst in the right liver measuring almost 14cm, as well as large cyst adjacent to the left lateral lobe measuring 9cm. There are also multiple much smaller cysts. All of these appear to be simple and benign in nature. Her abdominal pain has been thoroughly evaluated and is likely related to the cysts.  I recommended laparoscopic fenestration with ablation of the lining of the cysts.  This can be scheduled electively with overnight observation in the hospital.  I reviewed the details of this procedure and the risk of cyst recurrence and the patient agrees to proceed with surgery.  She will be contacted to schedule an elective surgery date.  Michaelle Birks, [facility], Hepatobiliary  and Pancreatic Surgery 02/05/22 2:30 PM

## 2022-02-08 ENCOUNTER — Other Ambulatory Visit (HOSPITAL_COMMUNITY): Payer: Self-pay | Admitting: Surgery

## 2022-02-08 ENCOUNTER — Other Ambulatory Visit: Payer: Self-pay | Admitting: Surgery

## 2022-02-08 DIAGNOSIS — K7689 Other specified diseases of liver: Secondary | ICD-10-CM

## 2022-02-11 ENCOUNTER — Ambulatory Visit: Payer: BC Managed Care – PPO | Admitting: Internal Medicine

## 2022-02-11 ENCOUNTER — Encounter: Payer: Self-pay | Admitting: Internal Medicine

## 2022-02-11 VITALS — BP 100/80 | HR 64 | Ht 62.5 in | Wt 124.3 lb

## 2022-02-11 DIAGNOSIS — K31A0 Gastric intestinal metaplasia, unspecified: Secondary | ICD-10-CM

## 2022-02-11 DIAGNOSIS — K2970 Gastritis, unspecified, without bleeding: Secondary | ICD-10-CM | POA: Diagnosis not present

## 2022-02-11 DIAGNOSIS — K7689 Other specified diseases of liver: Secondary | ICD-10-CM | POA: Diagnosis not present

## 2022-02-11 DIAGNOSIS — K219 Gastro-esophageal reflux disease without esophagitis: Secondary | ICD-10-CM

## 2022-02-11 DIAGNOSIS — R1011 Right upper quadrant pain: Secondary | ICD-10-CM

## 2022-02-11 DIAGNOSIS — K2990 Gastroduodenitis, unspecified, without bleeding: Secondary | ICD-10-CM

## 2022-02-11 NOTE — Progress Notes (Signed)
Chief Complaint: Hepatic cyst, RUQ ab pain  HPI : 51 year old female with history of anxiety, migraines, hypothyroidism, and HTN presents for follow up of RUQ ab pain  Interval History: Patient has still been experiencing RUQ ab pain. She saw Dr. Zenia Resides with general surgery who agreed that she is likely having pain due the liver cysts. She is scheduled for cyst surgery later this morning. She did try Miralax and thinks that it helped slightly with the pain. Unfortunately the Miralax also caused her to have diarrhea so she has had difficulty taking it. She is taking the fiber. Has been trying to drink enough water. She has cut down on caffeine because she felt like it was irritating her. She stopped her omeprazole and is now taking famotidine. She did try to go without an acid reflux medication temporarily but had a recurrence in reflux symptom. She has had to use her Zofran less.  Current Outpatient Medications  Medication Sig Dispense Refill   cholecalciferol (VITAMIN D3) 25 MCG (1000 UNIT) tablet Take 1,000 Units by mouth daily.     Cyanocobalamin (VITAMIN B12 PO) Take 1 tablet by mouth daily.     cyclobenzaprine (FLEXERIL) 5 MG tablet Take 1 tablet (5 mg total) by mouth at bedtime as needed for muscle spasms. 30 tablet 1   escitalopram (LEXAPRO) 20 MG tablet Take 20 mg by mouth daily.     famotidine (PEPCID) 20 MG tablet Take 20 mg by mouth daily.     FIBER PO Take 1-2 tablets by mouth daily at 6 (six) AM.     levothyroxine (SYNTHROID) 112 MCG tablet TAKE 1 TABLET(112 MCG) BY MOUTH DAILY BEFORE BREAKFAST 90 tablet 3   LORazepam (ATIVAN) 0.5 MG tablet Take 0.5 mg by mouth daily as needed.     ondansetron (ZOFRAN) 4 MG tablet Take 1 tablet (4 mg total) by mouth every 8 (eight) hours as needed for nausea or vomiting. 20 tablet 0   Current Facility-Administered Medications  Medication Dose Route Frequency Provider Last Rate Last Admin   0.9 %  sodium chloride infusion  500 mL Intravenous Once  Sharyn Creamer, MD       Review of Systems: All systems reviewed and negative except where noted in HPI.   Physical Exam: BP 100/80 (BP Location: Left Arm, Patient Position: Sitting, Cuff Size: Normal)   Pulse 64   Ht 5' 2.5" (1.588 m) Comment: height measured without shoes  Wt 124 lb 5 oz (56.4 kg)   BMI 22.37 kg/m  Constitutional: Pleasant,well-developed, female in no acute distress. HEENT: Normocephalic and atraumatic. Conjunctivae are normal. No scleral icterus. Cardiovascular: Normal rate, regular rhythm.  Pulmonary/chest: Effort normal and breath sounds normal. No wheezing, rales or rhonchi. Abdominal: Soft, nondistended, tender in the RUQ area. Bowel sounds intact Extremities: No edema Neurological: Alert and oriented to person place and time. Skin: Skin is warm and dry. No rashes noted. Psychiatric: Normal mood and affect. Behavior is normal.  Labs 06/2021: CBC and CMP unremarkable. Lipase nml.  Labs 08/2021: TSH nml  RUQ U/S 06/28/21: IMPRESSION: Massive enlargement of multiple hepatic cysts in comparison to prior ultrasound in 2006, measuring up to 13.5 cm in right hepatic lobe and 12.0 cm in left hepatic lobe, with complex appearance. This could potentially be a source of abdominal pain and can be further evaluated with nonemergent outpatient MRI with and without contrast. Normal appearance of the gallbladder.  MR Abd 07/18/21: IMPRESSION: Numerous bilobar hepatic cysts measuring up to 13.7  cm. No suspicious hepatic lesion.  EGD 10/10/21: - White nummular lesions in esophageal mucosa. Biopsied. - Erythematous mucosa in the antrum. Biopsied. - Normal examined duodenum. Biopsied. Path: 1. Surgical [P], 2nd portion of duodenum BENIGN DUODENAL MUCOSA WITH NO DIAGNOSTIC ABNORMALITY 2. Surgical [P], gastric antrum and gastric body REACTIVE GASTROPATHY AND MILD CHRONIC GASTRITIS WITH LYMPHOID AGGREGATE FOCAL INTESTINAL METAPLASIA HELICOBACTER STAIN NEGATIVE ([facility],  ADEQUATE CONTROL) NEGATIVE FOR DYSPLASIA AND CARCINOMA 3. Surgical [P], mid esophagus and distal esophagus REFLUX ESOPHAGITIS (3 EOS/HIGH POWER FIELD)  Colonoscopy 10/10/21: - A few diverticula were found in the sigmoid colon. - Non-bleeding internal hemorrhoids were found during retroflexion.  ASSESSMENT AND PLAN:  Liver cysts RUQ ab pain Gastric intestinal metaplasia Gastritis GERD Patient presents for follow-up of her right upper quadrant abdominal pain that started in 06/2021.  Ab pain is still about the same and is most likely due to large liver cysts that were seen on imaging. She is scheduled for laparoscopic fenestration with ablation of the lining of the cysts. Patient had some benefit from having more regular BMs but was not able to tolerate taking miralax regularly. She plans to stick with increasing her water intake and continuing daily fiber supplement.  - Continue famotidine 20 mg QHS - Cont fiber supplement and drinking water - Scheduled for surgery of liver cysts later this month - Recall for repeat EGD in 10/2022 - RTC in 6 months  [name], MD

## 2022-02-11 NOTE — Patient Instructions (Signed)
If you are age 49 or older, your body mass index should be between 23-30. Your Body mass index is 22.37 kg/m. If this is out of the aforementioned range listed, please consider follow up with your Primary Care Provider.  If you are age 56 or younger, your body mass index should be between 19-25. Your Body mass index is 22.37 kg/m. If this is out of the aformentioned range listed, please consider follow up with your Primary Care Provider.    Follow up in 6 months   The [facility] providers would like to encourage you to use [facility] to communicate with providers for non-urgent requests or questions.  Due to long hold times on the telephone, sending your provider a message by [facility] may be a faster and more efficient way to get a response.  Please allow 48 business hours for a response.  Please remember that this is for non-urgent requests.   Thank you for entrusting me with your care and for choosing [facility], Dr. [name]

## 2022-02-22 NOTE — Pre-Procedure Instructions (Signed)
Surgical Instructions    Your procedure is scheduled on Thursday 02/28/22.   Report to [facility] Main Entrance "A" at 05:30 A.M., then check in with the Admitting office.  Call this number if you have problems the morning of surgery:  [phone]   If you have any questions prior to your surgery date call [phone]: Open Monday-Friday 8am-4pm    Remember:  Do not eat after midnight the night before your surgery  You may drink clear liquids until 04:30 A.M. the morning of your surgery.   Clear liquids allowed are: Water, Non-Citrus Juices (without pulp), Carbonated Beverages, Clear Tea, Black Coffee ONLY (NO MILK, CREAM OR POWDERED CREAMER of any kind), and Gatorade    Take these medicines the morning of surgery with A SIP OF WATER:   escitalopram (LEXAPRO)  famotidine (PEPCID)   levothyroxine (SYNTHROID)    Take these medicines if needed:   cyclobenzaprine (FLEXERIL)  LORazepam (ATIVAN)  ondansetron (ZOFRAN)  As of today, STOP taking any Aspirin (unless otherwise instructed by your surgeon) Aleve, Naproxen, Ibuprofen, Motrin, Advil, Goody's, BC's, all herbal medications, fish oil, and all vitamins.           Do not wear jewelry or makeup Do not wear lotions, powders, perfumes/colognes, or deodorant. Do not shave 48 hours prior to surgery.  Men may shave face and neck. Do not bring valuables to the hospital. Do not wear nail polish, gel polish, artificial nails, or any other type of covering on natural nails (fingers and toes) If you have artificial nails or gel coating that need to be removed by a nail salon, please have this removed prior to surgery. Artificial nails or gel coating may interfere with anesthesia's ability to adequately monitor your vital signs.  [facility] is not responsible for any belongings or valuables. .   Do NOT Smoke (Tobacco/Vaping)  24 hours prior to your procedure  If you use a CPAP at night, you may bring your mask for your overnight stay.    Contacts, glasses, hearing aids, dentures or partials may not be worn into surgery, please bring cases for these belongings   For patients admitted to the hospital, discharge time will be determined by your treatment team.   Patients discharged the day of surgery will not be allowed to drive home, and someone needs to stay with them for 24 hours.   SURGICAL WAITING ROOM VISITATION Patients having surgery or a procedure in a hospital may have two support people. Children under the age of 46 must have an adult with them who is not the patient. They may stay in the waiting area during the procedure and may switch out with other visitors. If the patient needs to stay at the hospital during part of their recovery, the visitor guidelines for inpatient rooms apply.  Please refer to the [facility] website for the visitor guidelines for Inpatients (after your surgery is over and you are in a regular room).       Special instructions:    Oral Hygiene is also important to reduce your risk of infection.  Remember - BRUSH YOUR TEETH THE MORNING OF SURGERY WITH YOUR REGULAR TOOTHPASTE   [facility]- Preparing For Surgery  Before surgery, you can play an important role. Because skin is not sterile, your skin needs to be as free of germs as possible. You can reduce the number of germs on your skin by washing with CHG (chlorahexidine gluconate) Soap before surgery.  CHG is an antiseptic cleaner which  kills germs and bonds with the skin to continue killing germs even after washing.     Please do not use if you have an allergy to CHG or antibacterial soaps. If your skin becomes reddened/irritated stop using the CHG.  Do not shave (including legs and underarms) for at least 48 hours prior to first CHG shower. It is OK to shave your face.  Please follow these instructions carefully.     Shower the NIGHT BEFORE SURGERY and the MORNING OF SURGERY with CHG Soap.   If you chose to wash your hair, wash  your hair first as usual with your normal shampoo. After you shampoo, rinse your hair and body thoroughly to remove the shampoo.  Then [employer] and genitals (private parts) with your normal soap and rinse thoroughly to remove soap.  After that Use CHG Soap as you would any other liquid soap. You can apply CHG directly to the skin and wash gently with a scrungie or a clean washcloth.   Apply the CHG Soap to your body ONLY FROM THE NECK DOWN.  Do not use on open wounds or open sores. Avoid contact with your eyes, ears, mouth and genitals (private parts). Wash Face and genitals (private parts)  with your normal soap.   Wash thoroughly, paying special attention to the area where your surgery will be performed.  Thoroughly rinse your body with warm water from the neck down.  DO NOT shower/wash with your normal soap after using and rinsing off the CHG Soap.  Pat yourself dry with a CLEAN TOWEL.  Wear CLEAN PAJAMAS to bed the night before surgery  Place CLEAN SHEETS on your bed the night before your surgery  DO NOT SLEEP WITH PETS.   Day of Surgery:  Take a shower with CHG soap. Wear Clean/Comfortable clothing the morning of surgery Do not apply any deodorants/lotions.   Remember to brush your teeth WITH YOUR REGULAR TOOTHPASTE.    If you received a COVID test during your pre-op visit, it is requested that you wear a mask when out in public, stay away from anyone that may not be feeling well, and notify your surgeon if you develop symptoms. If you have been in contact with anyone that has tested positive in the last 10 days, please notify your surgeon.    Please read over the following fact sheets that you were given.

## 2022-02-25 ENCOUNTER — Encounter (HOSPITAL_COMMUNITY)
Admission: RE | Admit: 2022-02-25 | Discharge: 2022-02-25 | Disposition: A | Discharge status: Home / Self Care | Payer: BC Managed Care – PPO | Source: Ambulatory Visit | Attending: Surgery | Admitting: Surgery

## 2022-02-25 ENCOUNTER — Ambulatory Visit (INDEPENDENT_AMBULATORY_CARE_PROVIDER_SITE_OTHER): Payer: BC Managed Care – PPO | Admitting: Internal Medicine

## 2022-02-25 ENCOUNTER — Encounter (HOSPITAL_COMMUNITY): Payer: Self-pay

## 2022-02-25 ENCOUNTER — Other Ambulatory Visit: Payer: Self-pay

## 2022-02-25 ENCOUNTER — Encounter: Payer: Self-pay | Admitting: Internal Medicine

## 2022-02-25 VITALS — BP 112/78 | HR 64 | Resp 18 | Ht 62.5 in | Wt 126.0 lb

## 2022-02-25 VITALS — BP 151/78 | HR 55 | Temp 98.4°F | Resp 17 | Ht 62.5 in | Wt 125.0 lb

## 2022-02-25 DIAGNOSIS — R1011 Right upper quadrant pain: Secondary | ICD-10-CM

## 2022-02-25 DIAGNOSIS — I10 Essential (primary) hypertension: Secondary | ICD-10-CM

## 2022-02-25 DIAGNOSIS — I4510 Unspecified right bundle-branch block: Secondary | ICD-10-CM | POA: Diagnosis not present

## 2022-02-25 DIAGNOSIS — E063 Autoimmune thyroiditis: Secondary | ICD-10-CM | POA: Insufficient documentation

## 2022-02-25 DIAGNOSIS — E039 Hypothyroidism, unspecified: Secondary | ICD-10-CM

## 2022-02-25 DIAGNOSIS — Z0000 Encounter for general adult medical examination without abnormal findings: Secondary | ICD-10-CM | POA: Diagnosis not present

## 2022-02-25 DIAGNOSIS — Z136 Encounter for screening for cardiovascular disorders: Secondary | ICD-10-CM

## 2022-02-25 DIAGNOSIS — Z01818 Encounter for other preprocedural examination: Secondary | ICD-10-CM | POA: Insufficient documentation

## 2022-02-25 DIAGNOSIS — K769 Liver disease, unspecified: Secondary | ICD-10-CM | POA: Insufficient documentation

## 2022-02-25 DIAGNOSIS — G43009 Migraine without aura, not intractable, without status migrainosus: Secondary | ICD-10-CM

## 2022-02-25 DIAGNOSIS — K7689 Other specified diseases of liver: Secondary | ICD-10-CM | POA: Diagnosis not present

## 2022-02-25 DIAGNOSIS — G43909 Migraine, unspecified, not intractable, without status migrainosus: Secondary | ICD-10-CM | POA: Diagnosis not present

## 2022-02-25 DIAGNOSIS — Z87891 Personal history of nicotine dependence: Secondary | ICD-10-CM | POA: Insufficient documentation

## 2022-02-25 HISTORY — DX: Autoimmune thyroiditis: E06.3

## 2022-02-25 HISTORY — DX: Other specified postprocedural states: R11.2

## 2022-02-25 HISTORY — DX: Other specified diseases of liver: K76.89

## 2022-02-25 HISTORY — DX: Headache, unspecified: R51.9

## 2022-02-25 HISTORY — DX: Other specified postprocedural states: Z98.890

## 2022-02-25 HISTORY — DX: Hypothyroidism, unspecified: E03.9

## 2022-02-25 LAB — CBC
HCT: 38.2 % (ref 36.0–46.0)
Hemoglobin: 12.8 g/dL (ref 12.0–15.0)
MCHC: 33.5 g/dL (ref 30.0–36.0)
MCV: 87.5 fl (ref 78.0–100.0)
Platelets: 184 10*3/uL (ref 150.0–400.0)
RBC: 4.37 Mil/uL (ref 3.87–5.11)
RDW: 13.3 % (ref 11.5–15.5)
WBC: 6.7 10*3/uL (ref 4.0–10.5)

## 2022-02-25 LAB — COMPREHENSIVE METABOLIC PANEL
ALT: 13 U/L (ref 0–35)
AST: 14 U/L (ref 0–37)
Albumin: 4 g/dL (ref 3.5–5.2)
Alkaline Phosphatase: 57 U/L (ref 39–117)
BUN: 10 mg/dL (ref 6–23)
CO2: 26 mEq/L (ref 19–32)
Calcium: 8.9 mg/dL (ref 8.4–10.5)
Chloride: 104 mEq/L (ref 96–112)
Creatinine, Ser: 0.67 mg/dL (ref 0.40–1.20)
GFR: 102.75 mL/min (ref >60.00)
Glucose, Bld: 95 mg/dL (ref 70–99)
Potassium: 3.9 mEq/L (ref 3.5–5.1)
Sodium: 136 mEq/L (ref 135–145)
Total Bilirubin: 0.5 mg/dL (ref 0.2–1.2)
Total Protein: 6.7 g/dL (ref 6.0–8.3)

## 2022-02-25 LAB — LIPID PANEL
Cholesterol: 183 mg/dL (ref 0–200)
HDL: 52.4 mg/dL (ref >39.00)
LDL Cholesterol: 109 mg/dL — ABNORMAL HIGH (ref 0–99)
NonHDL: 131.05
Total CHOL/HDL Ratio: 4
Triglycerides: 108 mg/dL (ref 0.0–149.0)
VLDL: 21.6 mg/dL (ref 0.0–40.0)

## 2022-02-25 LAB — TSH: TSH: 0.81 u[IU]/mL (ref 0.35–5.50)

## 2022-02-25 LAB — VITAMIN D 25 HYDROXY (VIT D DEFICIENCY, FRACTURES): VITD: 60.27 ng/mL (ref 30.00–100.00)

## 2022-02-25 LAB — T4, FREE: Free T4: 0.95 ng/dL (ref 0.60–1.60)

## 2022-02-25 LAB — VITAMIN B12: Vitamin B-12: 1109 pg/mL — ABNORMAL HIGH (ref 211–911)

## 2022-02-25 MED ORDER — ESCITALOPRAM OXALATE 20 MG PO TABS
20.0000 mg | ORAL_TABLET | Freq: Every day | ORAL | 3 refills | Status: DC
Start: 2022-02-25 — End: 2023-03-17

## 2022-02-25 NOTE — Progress Notes (Signed)
PCP - Hillard Danker, MD Cardiologist - denies  PPM/ICD - denies Chest x-ray - N/A EKG - 02/25/2022  Stress Test - denies ECHO - denies Cardiac Cath - denies  Sleep Study - denies  Blood Thinner Instructions:N/A Aspirin Instructions:N/A  ERAS Protcol - ERAS protocol- no drink order COVID TEST- N/A   Anesthesia review: Pt having lab work done at PCP after PAT appointment, including CMP and CBC. Follow up on lab work from PCP- in epic.  Patient denies shortness of breath, fever, cough and chest pain at PAT appointment   All instructions explained to the patient, with a verbal understanding of the material. Patient agrees to go over the instructions while at home for a better understanding. The opportunity to ask questions was provided.

## 2022-02-25 NOTE — Assessment & Plan Note (Signed)
Flu shot yearly. Covid-19 counseled. Tetanus up to date. Colonoscopy up to date. Mammogram up to date, pap smear not indicated. Counseled about sun safety and mole surveillance. Counseled about the dangers of distracted driving. Given 10 year screening recommendations.

## 2022-02-25 NOTE — Assessment & Plan Note (Signed)
Checking TSH and free T4 and adjust as needed synthroid 112 mcg daily.

## 2022-02-26 NOTE — Anesthesia Preprocedure Evaluation (Addendum)
Anesthesia Evaluation  Patient identified by MRN, date of birth, ID band Patient awake    Reviewed: Allergy & Precautions, NPO status , Patient's Chart, lab work & pertinent test results  History of Anesthesia Complications Negative for: history of anesthetic complications  Airway Mallampati: II  TM Distance: >3 FB Neck ROM: Full    Dental  (+) Dental Advisory Given   Pulmonary neg pulmonary ROS, former smoker,    Pulmonary exam normal        Cardiovascular negative cardio ROS Normal cardiovascular exam     Neuro/Psych  Headaches, Anxiety    GI/Hepatic negative GI ROS, Hepatic cysts   Endo/Other  Hypothyroidism   Renal/GU negative Renal ROS  negative genitourinary   Musculoskeletal negative musculoskeletal ROS (+)   Abdominal   Peds  Hematology negative hematology ROS (+)   Anesthesia Other Findings   Reproductive/Obstetrics                          Anesthesia Physical Anesthesia Plan  ASA: 2  Anesthesia Plan: General   Post-op Pain Management: Tylenol PO (pre-op)* and Toradol IV (intra-op)*   Induction: Intravenous  PONV Risk Score and Plan: 3 and Ondansetron, Dexamethasone, Treatment may vary due to age or medical condition and Midazolam  Airway Management Planned: Oral ETT  Additional Equipment: None  Intra-op Plan:   Post-operative Plan: Extubation in OR  Informed Consent: I have reviewed the patients History and Physical, chart, labs and discussed the procedure including the risks, benefits and alternatives for the proposed anesthesia with the patient or authorized representative who has indicated his/her understanding and acceptance.     Dental advisory given  Plan Discussed with:   Anesthesia Plan Comments: (PAT note written 02/26/2022 by Myra Gianotti, PA-C. )      Anesthesia Quick Evaluation

## 2022-02-28 ENCOUNTER — Ambulatory Visit (HOSPITAL_COMMUNITY)
Admission: RE | Admit: 2022-02-28 | Discharge: 2022-02-28 | Disposition: A | Discharge status: Home / Self Care | Payer: BC Managed Care – PPO | Source: Ambulatory Visit | Attending: Surgery | Admitting: Surgery

## 2022-02-28 ENCOUNTER — Encounter (HOSPITAL_COMMUNITY): Payer: Self-pay | Admitting: Surgery

## 2022-02-28 ENCOUNTER — Encounter (HOSPITAL_COMMUNITY)
Admission: RE | Disposition: A | Discharge status: Home / Self Care | Payer: Self-pay | Source: Home / Self Care | Attending: Surgery

## 2022-02-28 ENCOUNTER — Ambulatory Visit (HOSPITAL_COMMUNITY): Payer: BC Managed Care – PPO | Admitting: Vascular Surgery

## 2022-02-28 ENCOUNTER — Ambulatory Visit (HOSPITAL_COMMUNITY): Payer: BC Managed Care – PPO | Admitting: Certified Registered Nurse Anesthetist

## 2022-02-28 ENCOUNTER — Observation Stay (HOSPITAL_COMMUNITY)
Admission: RE | Admit: 2022-02-28 | Discharge: 2022-03-01 | Disposition: A | Discharge status: Home / Self Care | Payer: BC Managed Care – PPO | Attending: Surgery | Admitting: Surgery

## 2022-02-28 ENCOUNTER — Other Ambulatory Visit: Payer: Self-pay

## 2022-02-28 DIAGNOSIS — Z87891 Personal history of nicotine dependence: Secondary | ICD-10-CM | POA: Diagnosis not present

## 2022-02-28 DIAGNOSIS — K7689 Other specified diseases of liver: Secondary | ICD-10-CM | POA: Diagnosis present

## 2022-02-28 DIAGNOSIS — Z79899 Other long term (current) drug therapy: Secondary | ICD-10-CM | POA: Diagnosis not present

## 2022-02-28 DIAGNOSIS — K838 Other specified diseases of biliary tract: Secondary | ICD-10-CM | POA: Diagnosis not present

## 2022-02-28 DIAGNOSIS — K835 Biliary cyst: Principal | ICD-10-CM | POA: Insufficient documentation

## 2022-02-28 HISTORY — PX: LAPAROSCOPIC PARTIAL HEPATECTOMY: SHX5909

## 2022-02-28 SURGERY — HEPATECTOMY, PARTIAL, LAPAROSCOPIC
Anesthesia: General | Site: Abdomen

## 2022-02-28 MED ORDER — FAMOTIDINE 20 MG PO TABS
20.0000 mg | ORAL_TABLET | Freq: Every day | ORAL | Status: DC
  Administered 2022-02-28 – 2022-03-01 (×2): 20 mg via ORAL
  Filled 2022-02-28 (×2): qty 1

## 2022-02-28 MED ORDER — OXYCODONE HCL 5 MG PO TABS: 5.0000 mg | ORAL_TABLET | Freq: Once | ORAL | Status: DC | PRN

## 2022-02-28 MED ORDER — ONDANSETRON HCL 4 MG/2ML IJ SOLN
INTRAMUSCULAR | Status: AC
  Filled 2022-02-28: qty 2

## 2022-02-28 MED ORDER — ONDANSETRON HCL 4 MG/2ML IJ SOLN
INTRAMUSCULAR | Status: DC | PRN
  Administered 2022-02-28 (×2): 4 mg via INTRAVENOUS

## 2022-02-28 MED ORDER — DOCUSATE SODIUM 100 MG PO CAPS
100.0000 mg | ORAL_CAPSULE | Freq: Two times a day (BID) | ORAL | Status: DC
  Administered 2022-03-01: 100 mg via ORAL
  Filled 2022-02-28 (×3): qty 1

## 2022-02-28 MED ORDER — ACETAMINOPHEN 500 MG PO TABS
1000.0000 mg | ORAL_TABLET | ORAL | Status: AC
  Administered 2022-02-28: 1000 mg via ORAL
  Filled 2022-02-28: qty 2

## 2022-02-28 MED ORDER — AMISULPRIDE (ANTIEMETIC) 5 MG/2ML IV SOLN: 10.0000 mg | Freq: Once | INTRAVENOUS | Status: DC | PRN

## 2022-02-28 MED ORDER — DEXAMETHASONE SODIUM PHOSPHATE 10 MG/ML IJ SOLN
INTRAMUSCULAR | Status: AC
  Filled 2022-02-28: qty 1

## 2022-02-28 MED ORDER — 0.9 % SODIUM CHLORIDE (POUR BTL) OPTIME
TOPICAL | Status: DC | PRN
  Administered 2022-02-28: 1000 mL

## 2022-02-28 MED ORDER — ORAL CARE MOUTH RINSE: 15.0000 mL | Freq: Once | OROMUCOSAL | Status: AC

## 2022-02-28 MED ORDER — OXYCODONE HCL 5 MG PO TABS
5.0000 mg | ORAL_TABLET | ORAL | Status: DC | PRN
  Administered 2022-02-28 – 2022-03-01 (×2): 5 mg via ORAL
  Filled 2022-02-28 (×2): qty 1

## 2022-02-28 MED ORDER — PROPOFOL 10 MG/ML IV BOLUS
INTRAVENOUS | Status: DC | PRN
  Administered 2022-02-28: 200 mg via INTRAVENOUS

## 2022-02-28 MED ORDER — DIPHENHYDRAMINE HCL 50 MG/ML IJ SOLN: 25.0000 mg | Freq: Four times a day (QID) | INTRAMUSCULAR | Status: DC | PRN

## 2022-02-28 MED ORDER — CHLORHEXIDINE GLUCONATE CLOTH 2 % EX PADS: 6.0000 | MEDICATED_PAD | Freq: Once | CUTANEOUS | Status: DC

## 2022-02-28 MED ORDER — CHLORHEXIDINE GLUCONATE 0.12 % MT SOLN
15.0000 mL | Freq: Once | OROMUCOSAL | Status: AC
  Administered 2022-02-28: 15 mL via OROMUCOSAL
  Filled 2022-02-28: qty 15

## 2022-02-28 MED ORDER — ROCURONIUM BROMIDE 10 MG/ML (PF) SYRINGE
PREFILLED_SYRINGE | INTRAVENOUS | Status: AC
  Filled 2022-02-28: qty 10

## 2022-02-28 MED ORDER — GABAPENTIN 300 MG PO CAPS
300.0000 mg | ORAL_CAPSULE | ORAL | Status: AC
  Administered 2022-02-28: 300 mg via ORAL
  Filled 2022-02-28: qty 1

## 2022-02-28 MED ORDER — KETOROLAC TROMETHAMINE 30 MG/ML IJ SOLN
INTRAMUSCULAR | Status: DC | PRN
  Administered 2022-02-28: 30 mg via INTRAVENOUS

## 2022-02-28 MED ORDER — ONDANSETRON HCL 4 MG/2ML IJ SOLN: 4.0000 mg | Freq: Once | INTRAMUSCULAR | Status: DC | PRN

## 2022-02-28 MED ORDER — LIDOCAINE 2% (20 MG/ML) 5 ML SYRINGE
INTRAMUSCULAR | Status: DC | PRN
  Administered 2022-02-28: 40 mg via INTRAVENOUS

## 2022-02-28 MED ORDER — CEFAZOLIN SODIUM-DEXTROSE 2-4 GM/100ML-% IV SOLN
2.0000 g | INTRAVENOUS | Status: AC
  Administered 2022-02-28: 2 g via INTRAVENOUS
  Filled 2022-02-28: qty 100

## 2022-02-28 MED ORDER — ACETAMINOPHEN 500 MG PO TABS
1000.0000 mg | ORAL_TABLET | Freq: Three times a day (TID) | ORAL | Status: DC
  Administered 2022-02-28 – 2022-03-01 (×5): 1000 mg via ORAL
  Filled 2022-02-28 (×5): qty 2

## 2022-02-28 MED ORDER — DIPHENHYDRAMINE HCL 25 MG PO CAPS: 25.0000 mg | ORAL_CAPSULE | Freq: Four times a day (QID) | ORAL | Status: DC | PRN

## 2022-02-28 MED ORDER — OXYCODONE HCL 5 MG/5ML PO SOLN: 5.0000 mg | Freq: Once | ORAL | Status: DC | PRN

## 2022-02-28 MED ORDER — MIDAZOLAM HCL 2 MG/2ML IJ SOLN
INTRAMUSCULAR | Status: AC
  Filled 2022-02-28: qty 2

## 2022-02-28 MED ORDER — DEXAMETHASONE SODIUM PHOSPHATE 10 MG/ML IJ SOLN
INTRAMUSCULAR | Status: DC | PRN
  Administered 2022-02-28: 10 mg via INTRAVENOUS

## 2022-02-28 MED ORDER — FENTANYL CITRATE (PF) 100 MCG/2ML IJ SOLN: 25.0000 ug | INTRAMUSCULAR | Status: DC | PRN

## 2022-02-28 MED ORDER — FENTANYL CITRATE (PF) 250 MCG/5ML IJ SOLN
INTRAMUSCULAR | Status: DC | PRN
  Administered 2022-02-28: 100 ug via INTRAVENOUS
  Administered 2022-02-28: 25 ug via INTRAVENOUS

## 2022-02-28 MED ORDER — BUPIVACAINE-EPINEPHRINE (PF) 0.25% -1:200000 IJ SOLN
INTRAMUSCULAR | Status: DC | PRN
  Administered 2022-02-28: 30 mL via PERINEURAL

## 2022-02-28 MED ORDER — METHOCARBAMOL 500 MG PO TABS
500.0000 mg | ORAL_TABLET | Freq: Four times a day (QID) | ORAL | Status: DC
Start: 2022-02-28 — End: 2022-03-01
  Administered 2022-02-28 – 2022-03-01 (×5): 500 mg via ORAL
  Filled 2022-02-28 (×5): qty 1

## 2022-02-28 MED ORDER — ROCURONIUM BROMIDE 10 MG/ML (PF) SYRINGE
PREFILLED_SYRINGE | INTRAVENOUS | Status: DC | PRN
  Administered 2022-02-28: 70 mg via INTRAVENOUS

## 2022-02-28 MED ORDER — SODIUM CHLORIDE 0.9 % IR SOLN
Status: DC | PRN
  Administered 2022-02-28 (×2): 1000 mL

## 2022-02-28 MED ORDER — SUCCINYLCHOLINE CHLORIDE 200 MG/10ML IV SOSY
PREFILLED_SYRINGE | INTRAVENOUS | Status: AC
  Filled 2022-02-28: qty 10

## 2022-02-28 MED ORDER — ENOXAPARIN SODIUM 40 MG/0.4ML IJ SOSY
40.0000 mg | PREFILLED_SYRINGE | INTRAMUSCULAR | Status: DC
  Administered 2022-03-01: 40 mg via SUBCUTANEOUS
  Filled 2022-02-28: qty 0.4

## 2022-02-28 MED ORDER — PROPOFOL 10 MG/ML IV BOLUS
INTRAVENOUS | Status: AC
  Filled 2022-02-28: qty 20

## 2022-02-28 MED ORDER — LEVOTHYROXINE SODIUM 112 MCG PO TABS
112.0000 ug | ORAL_TABLET | Freq: Every day | ORAL | Status: DC
Start: 2022-03-01 — End: 2022-03-01
  Administered 2022-03-01: 112 ug via ORAL
  Filled 2022-02-28: qty 1

## 2022-02-28 MED ORDER — ESCITALOPRAM OXALATE 20 MG PO TABS
20.0000 mg | ORAL_TABLET | Freq: Every day | ORAL | Status: DC
  Filled 2022-02-28 (×2): qty 1

## 2022-02-28 MED ORDER — FENTANYL CITRATE (PF) 250 MCG/5ML IJ SOLN
INTRAMUSCULAR | Status: AC
  Filled 2022-02-28: qty 5

## 2022-02-28 MED ORDER — MIDAZOLAM HCL 2 MG/2ML IJ SOLN
INTRAMUSCULAR | Status: DC | PRN
  Administered 2022-02-28: 2 mg via INTRAVENOUS

## 2022-02-28 MED ORDER — ONDANSETRON 4 MG PO TBDP: 4.0000 mg | ORAL_TABLET | Freq: Four times a day (QID) | ORAL | Status: DC | PRN

## 2022-02-28 MED ORDER — POLYETHYLENE GLYCOL 3350 17 G PO PACK: 17.0000 g | PACK | Freq: Every day | ORAL | Status: DC | PRN

## 2022-02-28 MED ORDER — LACTATED RINGERS IV SOLN: INTRAVENOUS | Status: DC

## 2022-02-28 MED ORDER — LIDOCAINE 2% (20 MG/ML) 5 ML SYRINGE
INTRAMUSCULAR | Status: AC
  Filled 2022-02-28: qty 5

## 2022-02-28 MED ORDER — ONDANSETRON HCL 4 MG/2ML IJ SOLN: 4.0000 mg | Freq: Four times a day (QID) | INTRAMUSCULAR | Status: DC | PRN

## 2022-02-28 MED ORDER — PHENYLEPHRINE 80 MCG/ML (10ML) SYRINGE FOR IV PUSH (FOR BLOOD PRESSURE SUPPORT)
PREFILLED_SYRINGE | INTRAVENOUS | Status: DC | PRN
  Administered 2022-02-28: 40 ug via INTRAVENOUS
  Administered 2022-02-28: 80 ug via INTRAVENOUS

## 2022-02-28 MED ORDER — SUGAMMADEX SODIUM 200 MG/2ML IV SOLN
INTRAVENOUS | Status: DC | PRN
  Administered 2022-02-28: 160 mg via INTRAVENOUS

## 2022-02-28 MED ORDER — BUPIVACAINE-EPINEPHRINE (PF) 0.25% -1:200000 IJ SOLN
INTRAMUSCULAR | Status: AC
  Filled 2022-02-28: qty 30

## 2022-02-28 SURGICAL SUPPLY — 88 items
ADH SKN CLS APL DERMABOND .7 (GAUZE/BANDAGES/DRESSINGS) ×2
APL LAPSCP 35 DL APL RGD (MISCELLANEOUS)
APL PRP STRL LF DISP 70% ISPRP (MISCELLANEOUS) ×2
APPLICATOR VISTASEAL 35 (MISCELLANEOUS) IMPLANT
BAG COUNTER SPONGE SURGICOUNT (BAG) ×4 IMPLANT
BAG SPEC RTRVL LRG 6X4 10 (ENDOMECHANICALS) ×2
BAG SPNG CNTER NS LX DISP (BAG) ×2
BLADE CLIPPER SURG (BLADE) IMPLANT
CANISTER SUCT 3000ML PPV (MISCELLANEOUS) IMPLANT
CHLORAPREP W/TINT 26 (MISCELLANEOUS) ×4 IMPLANT
CLIP LIGATING HEMO O LOK GREEN (MISCELLANEOUS) IMPLANT
CLIP VESOCCLUDE MED 6/CT (CLIP) IMPLANT
CLIP VESOCCLUDE SM WIDE 24/CT (CLIP) IMPLANT
CLIP VESOLOCK MED 6/CT (CLIP) IMPLANT
COVER SURGICAL LIGHT HANDLE (MISCELLANEOUS) ×4 IMPLANT
DERMABOND ADVANCED (GAUZE/BANDAGES/DRESSINGS) ×1
DERMABOND ADVANCED .7 DNX12 (GAUZE/BANDAGES/DRESSINGS) ×3 IMPLANT
DOPPLER CAUTERY SUPPRESSOR (INSTRUMENTS) ×2 IMPLANT
DRAIN CHANNEL 19F RND (DRAIN) IMPLANT
DRAIN PENROSE 0.5X18 (DRAIN) IMPLANT
DRAPE INCISE IOBAN 66X45 STRL (DRAPES) ×4 IMPLANT
DRAPE WARM FLUID 44X44 (DRAPES) ×2 IMPLANT
ELECT BLADE 6.5 EXT (BLADE) IMPLANT
ELECT CAUTERY BLADE 6.4 (BLADE) ×4 IMPLANT
ELECT PAD DSPR THERM+ ADLT (MISCELLANEOUS) ×4 IMPLANT
ELECT REM PT RETURN 9FT ADLT (ELECTROSURGICAL) ×3
ELECTRODE REM PT RTRN 9FT ADLT (ELECTROSURGICAL) ×3 IMPLANT
EVACUATOR SILICONE 100CC (DRAIN) IMPLANT
GLOVE BIO SURGEON STRL SZ 6 (GLOVE) ×4 IMPLANT
GLOVE INDICATOR 6.5 STRL GRN (GLOVE) ×4 IMPLANT
GLOVE SURG POLY MICRO LF SZ5.5 (GLOVE) ×4 IMPLANT
GOWN STRL REUS W/ TWL LRG LVL3 (GOWN DISPOSABLE) ×9 IMPLANT
GOWN STRL REUS W/TWL LRG LVL3 (GOWN DISPOSABLE) ×9
HEMOSTAT SNOW SURGICEL 2X4 (HEMOSTASIS) ×2 IMPLANT
KIT BASIN OR (CUSTOM PROCEDURE TRAY) ×4 IMPLANT
KIT TURNOVER KIT B (KITS) ×4 IMPLANT
L-HOOK LAP DISP 36CM (ELECTROSURGICAL)
LHOOK LAP DISP 36CM (ELECTROSURGICAL) ×2 IMPLANT
LOOP VESSEL MAXI BLUE (MISCELLANEOUS) IMPLANT
NS IRRIG 1000ML POUR BTL (IV SOLUTION) ×8 IMPLANT
PAD ARMBOARD 7.5X6 YLW CONV (MISCELLANEOUS) ×8 IMPLANT
PENCIL BUTTON HOLSTER BLD 10FT (ELECTRODE) ×4 IMPLANT
POUCH LAPAROSCOPIC INSTRUMENT (MISCELLANEOUS) ×4 IMPLANT
POUCH SPECIMEN RETRIEVAL 10MM (ENDOMECHANICALS) ×4 IMPLANT
PROBE LASER ARGON (MISCELLANEOUS) ×2 IMPLANT
RELOAD STAPLE 60 2.6 WHT THN (STAPLE) IMPLANT
RELOAD STAPLE 60 3.6 BLU REG (STAPLE) IMPLANT
RELOAD STAPLER BLUE 60MM (STAPLE) IMPLANT
RELOAD STAPLER WHITE 60MM (STAPLE) IMPLANT
SEALER BIPOLAR AQUA END DBS8.7 (INSTRUMENTS) ×4 IMPLANT
SET IRRIG TUBING LAPAROSCOPIC (IRRIGATION / IRRIGATOR) ×4 IMPLANT
SET TUBE SMOKE EVAC HIGH FLOW (TUBING) ×4 IMPLANT
SHEARS HARMONIC ACE PLUS 36CM (ENDOMECHANICALS) ×4 IMPLANT
SLEEVE ENDOPATH XCEL 5M (ENDOMECHANICALS) ×6 IMPLANT
SPONGE T-LAP 18X18 ~~LOC~~+RFID (SPONGE) IMPLANT
STAPLE ECHEON FLEX 60 POW ENDO (STAPLE) IMPLANT
STAPLER RELOAD BLUE 60MM (STAPLE)
STAPLER RELOAD WHITE 60MM (STAPLE)
STAPLER VISISTAT 35W (STAPLE) ×2 IMPLANT
SUT ETHILON 1 LR 30 (SUTURE) IMPLANT
SUT ETHILON 2 0 FS 18 (SUTURE) IMPLANT
SUT MNCRL AB 4-0 PS2 18 (SUTURE) ×6 IMPLANT
SUT PDS AB 1 TP1 96 (SUTURE) IMPLANT
SUT PDS II 0 TP-1 LOOPED 60 (SUTURE) IMPLANT
SUT PROLENE 3 0 SH 48 (SUTURE) ×2 IMPLANT
SUT PROLENE 3 0 SH DA (SUTURE) ×4 IMPLANT
SUT PROLENE 4 0 RB 1 (SUTURE)
SUT PROLENE 4-0 RB1 .5 CRCL 36 (SUTURE) ×2 IMPLANT
SUT PROLENE 4-0 RB1 18X2 ARM (SUTURE) ×2 IMPLANT
SUT VIC AB 2-0 CT1 27 (SUTURE)
SUT VIC AB 2-0 CT1 TAPERPNT 27 (SUTURE) IMPLANT
SUT VIC AB 2-0 SH 18 (SUTURE) IMPLANT
SUT VIC AB 3-0 SH 18 (SUTURE) IMPLANT
SUT VICRYL AB 2 0 TIES (SUTURE) IMPLANT
SUT VICRYL AB 3 0 TIES (SUTURE) IMPLANT
SYS LAPSCP GELPORT 120MM (MISCELLANEOUS)
SYSTEM LAPSCP GELPORT 120MM (MISCELLANEOUS) IMPLANT
TOWEL GREEN STERILE (TOWEL DISPOSABLE) ×4 IMPLANT
TOWEL GREEN STERILE FF (TOWEL DISPOSABLE) ×4 IMPLANT
TRAY FOLEY MTR SLVR 16FR STAT (SET/KITS/TRAYS/PACK) IMPLANT
TRAY LAPAROSCOPIC MC (CUSTOM PROCEDURE TRAY) ×4 IMPLANT
TROCAR XCEL 12X100 BLDLESS (ENDOMECHANICALS) ×2 IMPLANT
TROCAR XCEL BLUNT TIP 100MML (ENDOMECHANICALS) ×4 IMPLANT
TROCAR XCEL NON-BLD 5MMX100MML (ENDOMECHANICALS) ×4 IMPLANT
TUBE CONNECTING 12X1/4 (SUCTIONS) IMPLANT
WARMER LAPAROSCOPE (MISCELLANEOUS) ×4 IMPLANT
WATER STERILE IRR 1000ML POUR (IV SOLUTION) ×2 IMPLANT
YANKAUER SUCT BULB TIP NO VENT (SUCTIONS) IMPLANT

## 2022-02-28 NOTE — Discharge Instructions (Addendum)
[facility] DISCHARGE INSTRUCTIONS  Activity No heavy lifting greater than 15 pounds for 4 weeks after surgery. Ok to shower in 24 hours, but do not bathe or submerge incisions underwater. Do not drive while taking narcotic pain medication.  Wound Care Your incisions are covered with skin glue called Dermabond. This will peel off on its own over time. You may shower and allow warm soapy water to run over your incisions. Gently pat dry. Do not submerge your incision underwater. Monitor your incision for any new redness, tenderness, or drainage.  When to Call us: Fever greater than 100.5 New redness, drainage, or swelling at incision site Severe pain, nausea, or vomiting Jaundice (yellowing of the whites of the eyes or skin)  Follow-up You have an appointment scheduled with Dr. Zenia Resides on March 19, 2022 at 10:50am. This will be at the [facility] office at [address], [place], [place], [place]. Please arrive at least 15 minutes prior to your scheduled appointment time.  For questions or concerns, please call the office at [phone].Marland Kitchen

## 2022-02-28 NOTE — Transfer of Care (Signed)
Immediate Anesthesia Transfer of Care Note  Patient: Dana Pace  Procedure(s) Performed: LAPAROSCOPIC FENESTRATION OF HEPATIC CYSTS (Abdomen)  Patient Location: PACU  Anesthesia Type:General  Level of Consciousness: drowsy  Airway & Oxygen Therapy: Patient Spontanous Breathing and Patient connected to nasal cannula oxygen  Post-op Assessment: Report given to RN and Post -op Vital signs reviewed and stable  Post vital signs: Reviewed and stable  Last Vitals:  Vitals Value Taken Time  BP 142/79 02/28/22 0906  Temp    Pulse 71 02/28/22 0912  Resp 13 02/28/22 0912  SpO2 99 % 02/28/22 0912  Vitals shown include unvalidated device data.  Last Pain:  Vitals:   02/28/22 0642  TempSrc:   PainSc: 0-No pain         Complications: No notable events documented.

## 2022-02-28 NOTE — TOC CM/SW Note (Signed)
  Transition of Care [facility]) Screening Note   Patient Details  Name: Dana Pace Date of Birth: 1971-06-20   Transition of Care [facility]) CM/SW Contact:    Marilu Favre, RN Phone Number: 02/28/2022, 2:53 PM    Transition of Care Department [facility]) has reviewed patient and no TOC needs have been identified at this time. We will continue to monitor patient advancement through interdisciplinary progression rounds. If new patient transition needs arise, please place a TOC consult.

## 2022-02-28 NOTE — Anesthesia Procedure Notes (Addendum)
Procedure Name: Intubation Date/Time: 02/28/2022 7:41 AM  Performed by: Leonor Liv, CRNAPre-anesthesia Checklist: Patient identified, Emergency Drugs available, Suction available and Patient being monitored Patient Re-evaluated:Patient Re-evaluated prior to induction Oxygen Delivery Method: Circle System Utilized Preoxygenation: Pre-oxygenation with 100% oxygen Induction Type: IV induction Ventilation: Mask ventilation without difficulty Laryngoscope Size: Mac and 3 Grade View: Grade I Tube type: Oral Tube size: 7.0 mm Number of attempts: 1 Airway Equipment and Method: Stylet and Oral airway Placement Confirmation: ETT inserted through vocal cords under direct vision, positive ETCO2 and breath sounds checked- equal and bilateral Tube secured with: Tape Dental Injury: Teeth and Oropharynx as per pre-operative assessment  Comments: Lauren Cozart, SRNA placed ETT under supervision.

## 2022-02-28 NOTE — Op Note (Signed)
Date: 02/28/22  Patient: Dana Pace MRN: [id number]  Preoperative Diagnosis: Hepatic cysts Postoperative Diagnosis: Same  Procedure: Laparoscopic fenestration of simple hepatic cysts  Surgeon: Michaelle Birks, MD Assistant: Celene Squibb, RNFA  EBL: Minimal  Anesthesia: General endotracheal  Specimens: Hepatic cyst wall  Indications: Ms. Yaklin is a 51 year old female who presented with persistent right upper quadrant abdominal pain with radiation to the flank.  Extensive work-up showed multiple simple hepatic cysts, the largest of which was in the dome of the liver in the right lobe.  After discussion of the risks and benefits of surgery, she is elected to proceed with laparoscopic fenestration of the cyst.  Findings: Multiple simple cysts within the liver containing serous fluid, the largest of which was in the dome (segment 7 and 8).  Large exophytic cyst originating from the left lateral segment.  Multiple smaller cysts.  Procedure details: Informed consent was obtained in the preoperative area prior to the procedure. The patient was brought to the operating room and placed on the table in the supine position.  General anesthesia was induced and appropriate lines and drains were placed for intraoperative monitoring. Perioperative antibiotics were administered per SCIP guidelines. The abdomen was prepped and draped in the usual sterile fashion. A pre-procedure timeout was taken verifying patient identity, surgical site and procedure to be performed.  A small supraumbilical skin incision was made, the subcutaneous tissue was divided with cautery, and the fascia was grasped and elevated.  The fascia was sharply incised and the peritoneal cavity was visualized.  A 12 mm Hassan trocar was inserted and the abdomen was insufflated.  The abdomen was visually inspected with no evidence of visceral or vascular injury.  Additional 5 mm ports were placed in the right upper quadrant and in the  subxiphoid area both under direct visualization.  There was a large dominant cyst visible at the dome of the liver.  There was also a large exophytic cyst originating from the left lateral liver, and multiple much smaller cysts near the gallbladder fossa.  The cyst at the dome of the liver was approached first.  The harmonic was used to open the wall of the cyst, and clear serous fluid was suctioned out.  Once the cyst was drained, it was unroofed by using harmonic shears to excise the wall.  Care was taken not to enter any of the surrounding hepatic parenchyma.  The cyst wall was excised and sent for routine pathology.  The lining of the cyst was then ablated using aquamantys.  The outline of the right hepatic vein was visible running just deep to the deeper cyst wall, and great care was taken to avoid this area when ablating the lining of the cyst.  More superiorly in the dome of the liver, deep to the dominant cyst, a smaller cyst was visible and this was opened with the aqua mantis and drained.  A much smaller cyst was present in segment 4B adjacent to the falciform ligament, approximately 3 cm in diameter.  This was unroofed using harmonic shears and the lining was ablated with the aquamantys.  A cyst of similar size near the gallbladder was fenestrated and drained in similar fashion.  Next the cyst on the left lateral liver was approached.  An additional 5 mm port was placed in the left upper quadrant under direct visualization.  The left lobe of the liver was elevated and it was clear that the cyst was originating from the liver and not adherent to the underlying  stomach.  The cyst wall was opened using harmonic shears and suction was used to evacuate the cyst of serous fluid.  The cyst wall was then excised using harmonic shears and sent for routine pathology.  The remaining lining adjacent to the left lateral liver was ablated using the aqua mantis.  Deep to this superficial cyst, there was an additional  cyst that shared a wall with the superficial cyst.  The wall was opened with the harmonic and the fluid was drained using suction, however it could not be unroofed because it was surrounded almost entirely by liver parenchyma.  At this point all cyst drainage sites were visually examined and appeared hemostatic with no evidence of bile leak.  The abdomen was irrigated with saline.  The ports were removed under direct visualization.  The supraumbilical port site fascia was closed with a 0 Vicryl suture.  The skin at all port sites was closed with subcuticular 4-0 Monocryl suture.  Dermabond was applied.  The patient tolerated the procedure well with no apparent complications.  All counts were correct x2 at the end of the procedure. The patient was extubated and taken to PACU in stable condition.  Michaelle Birks, MD 02/28/22 9:09 AM

## 2022-02-28 NOTE — Interval H&P Note (Signed)
History and Physical Interval Note:  02/28/2022 7:11 AM  Dana Pace  has presented today for surgery, with the diagnosis of HEPATIC CYSTS.  The various methods of treatment have been discussed with the patient and family. After consideration of risks, benefits and other options for treatment, the patient has consented to  Procedure(s): LAPAROSCOPIC FENESTRATION OF HEPATIC CYSTS (N/A) POSSIBLE INTRAOPERATIVE ULTRASOUND (N/A) as a surgical intervention.  The patient's history has been reviewed, patient examined, no change in status, stable for surgery.  I have reviewed the patient's chart and labs.  Questions were answered to the patient's satisfaction.     [name]

## 2022-03-01 ENCOUNTER — Encounter (HOSPITAL_COMMUNITY): Payer: Self-pay | Admitting: Surgery

## 2022-03-01 DIAGNOSIS — Z87891 Personal history of nicotine dependence: Secondary | ICD-10-CM | POA: Diagnosis not present

## 2022-03-01 DIAGNOSIS — Z79899 Other long term (current) drug therapy: Secondary | ICD-10-CM | POA: Diagnosis not present

## 2022-03-01 DIAGNOSIS — K7689 Other specified diseases of liver: Secondary | ICD-10-CM | POA: Diagnosis not present

## 2022-03-01 DIAGNOSIS — K835 Biliary cyst: Secondary | ICD-10-CM | POA: Diagnosis not present

## 2022-03-01 LAB — SURGICAL PATHOLOGY

## 2022-03-01 MED ORDER — METHOCARBAMOL 500 MG PO TABS: 500.0000 mg | ORAL_TABLET | Freq: Four times a day (QID) | ORAL | 0 refills | Status: DC | PRN

## 2022-03-01 MED ORDER — OXYCODONE HCL 5 MG PO TABS
5.0000 mg | ORAL_TABLET | Freq: Four times a day (QID) | ORAL | 0 refills | Status: AC | PRN
Start: 2022-03-01 — End: 2022-03-06

## 2022-03-01 MED ORDER — DOCUSATE SODIUM 100 MG PO CAPS: 100.0000 mg | ORAL_CAPSULE | Freq: Two times a day (BID) | ORAL | 0 refills | Status: DC

## 2022-03-01 MED ORDER — ACETAMINOPHEN 500 MG PO TABS: 1000.0000 mg | ORAL_TABLET | Freq: Three times a day (TID) | ORAL | 0 refills | Status: DC | PRN

## 2022-03-01 NOTE — Anesthesia Postprocedure Evaluation (Signed)
Anesthesia Post Note  Patient: Dana Pace  Procedure(s) Performed: LAPAROSCOPIC FENESTRATION OF HEPATIC CYSTS (Abdomen)     Patient location during evaluation: PACU Anesthesia Type: General Level of consciousness: awake and alert Pain management: pain level controlled Vital Signs Assessment: post-procedure vital signs reviewed and stable Respiratory status: spontaneous breathing, nonlabored ventilation and respiratory function stable Cardiovascular status: blood pressure returned to baseline and stable Postop Assessment: no apparent nausea or vomiting Anesthetic complications: no   No notable events documented.  Last Vitals:  Vitals:   03/01/22 0552 03/01/22 0821  BP: 116/71 121/66  Pulse: 63 62  Resp:    Temp: 36.7 C 36.8 C  SpO2: 100% 100%    Last Pain:  Vitals:   03/01/22 1230  TempSrc:   PainSc: 7    Pain Goal: Patients Stated Pain Goal: 3 (02/28/22 0952)                 Lidia Collum

## 2022-03-01 NOTE — Discharge Summary (Signed)
Physician Discharge Summary   Patient ID: Dana Pace [id number] 51 y.o. 10-25-1970  Admit date: 02/28/2022  Discharge date and time: 03/01/2022  Admitting Physician: [name], MD   Discharge Physician: Michaelle Birks, MD  Admission Diagnoses: Hepatic cyst [K76.89]  Discharge Diagnoses: Hepatic cyst  Admission Condition: good  Discharged Condition: good  Indication for Admission: Dana Pace is a 51 yo female who presented with persistent RUQ abdominal and right flank pain, and was found to have multiple large benign hepatic cysts. Extensive workup did not show another etiology of her symptoms. She agreed to proceed with laparoscopic fenestration of the cysts.  Hospital Course: The patient was taken to the OR on 02/28/22 for a laparoscopic fenestration of her hepatic cysts. For further details of this procedure please see separately dictated operative note. Postoperatively she was admitted to the med-surg floor in stable condition. She was advanced to a regular diet, which she tolerated with no nausea or vomiting. On the morning of POD1 she was tolerating oral intake, ambulating, and pain was controlled on oral medications. She was afebrile and hemodynamically stable. She was examined and deemed appropriate for discharge home.  Consults: None  Significant Diagnostic Studies: N/A  Treatments: analgesia: acetaminophen, oxycodone and robaxin; and surgery: laparoscopic fenestration of hepatic cysts  Discharge Exam: General: resting comfortably, NAD Neuro: alert and oriented, no focal deficits Resp: normal work of breathing on room air Abdomen: soft, nondistended, nontender to palpation. Incisions clean and dry, mild ecchymosis but no erythema or induration. Extremities: warm and well-perfused  Disposition: Discharge disposition: 01-Home or Self Care       Patient Instructions:  Allergies as of 03/01/2022       Reactions   Augmentin [amoxicillin-pot Clavulanate]  Diarrhea   can take PCN   Prednisone Other (See Comments)   Numbness to face         Medication List     TAKE these medications    acetaminophen 500 MG tablet Commonly known as: TYLENOL Take 2 tablets (1,000 mg total) by mouth every 8 (eight) hours as needed for mild pain.   cholecalciferol 25 MCG (1000 UNIT) tablet Commonly known as: VITAMIN D3 Take 1,000 Units by mouth daily.   cyclobenzaprine 5 MG tablet Commonly known as: FLEXERIL Take 1 tablet (5 mg total) by mouth at bedtime as needed for muscle spasms.   docusate sodium 100 MG capsule Commonly known as: COLACE Take 1 capsule (100 mg total) by mouth 2 (two) times daily.   escitalopram 20 MG tablet Commonly known as: LEXAPRO Take 1 tablet (20 mg total) by mouth daily.   famotidine 20 MG tablet Commonly known as: PEPCID Take 20 mg by mouth daily.   FIBER PO Take 2 capsules by mouth daily at 6 (six) AM.   levothyroxine 112 MCG tablet Commonly known as: SYNTHROID TAKE 1 TABLET(112 MCG) BY MOUTH DAILY BEFORE BREAKFAST   LORazepam 0.5 MG tablet Commonly known as: ATIVAN Take 0.5 mg by mouth daily as needed for anxiety.   methocarbamol 500 MG tablet Commonly known as: ROBAXIN Take 1 tablet (500 mg total) by mouth every 6 (six) hours as needed for muscle spasms.   ondansetron 4 MG tablet Commonly known as: Zofran Take 1 tablet (4 mg total) by mouth every 8 (eight) hours as needed for nausea or vomiting.   oxyCODONE 5 MG immediate release tablet Commonly known as: Oxy IR/ROXICODONE Take 1 tablet (5 mg total) by mouth every 6 (six) hours as needed for up to  5 days for severe pain.   VITAMIN B12 PO Take 1 tablet by mouth daily.       Activity: ambulate in house, no driving while on analgesics, and no heavy lifting for 4 weeks Diet: regular diet Wound Care: keep wound clean and dry. Ok to shower.  Follow-up with Dr. Zenia Resides 03/19/22.  Signed: [name] 03/01/2022 7:15 AM

## 2022-03-01 NOTE — Plan of Care (Signed)
  Problem: Nutrition: Goal: Adequate nutrition will be maintained Outcome: Progressing   Problem: Pain Managment: Goal: General experience of comfort will improve Outcome: Progressing   Problem: Safety: Goal: Ability to remain free from injury will improve Outcome: Progressing   

## 2022-03-01 NOTE — Progress Notes (Signed)
Mobility Specialist Progress Note:   03/01/22 1400  Mobility  Activity Ambulated independently in hallway  Level of Assistance Independent  Assistive Device None  Distance Ambulated (ft) 550 ft  Activity Response Tolerated well  $Mobility charge 1 Mobility   Pt asx throughout ambulation. Back in chair with all needs met.   Nelta Numbers Acute Rehab Secure Chat or Office Phone: [phone]

## 2022-03-11 ENCOUNTER — Ambulatory Visit
Admission: RE | Admit: 2022-03-11 | Discharge: 2022-03-11 | Disposition: A | Discharge status: Home / Self Care | Payer: BC Managed Care – PPO | Source: Ambulatory Visit | Attending: Internal Medicine | Admitting: Internal Medicine

## 2022-03-11 ENCOUNTER — Other Ambulatory Visit: Payer: Self-pay | Admitting: Internal Medicine

## 2022-03-11 DIAGNOSIS — N6489 Other specified disorders of breast: Secondary | ICD-10-CM

## 2022-03-11 DIAGNOSIS — R928 Other abnormal and inconclusive findings on diagnostic imaging of breast: Secondary | ICD-10-CM

## 2022-03-25 ENCOUNTER — Ambulatory Visit: Payer: Self-pay | Admitting: Podiatry

## 2022-06-20 DIAGNOSIS — F320 Major depressive disorder, single episode, mild: Secondary | ICD-10-CM | POA: Diagnosis not present

## 2022-06-20 DIAGNOSIS — F411 Generalized anxiety disorder: Secondary | ICD-10-CM | POA: Diagnosis not present

## 2022-06-27 DIAGNOSIS — F411 Generalized anxiety disorder: Secondary | ICD-10-CM | POA: Diagnosis not present

## 2022-06-27 DIAGNOSIS — F320 Major depressive disorder, single episode, mild: Secondary | ICD-10-CM | POA: Diagnosis not present

## 2022-07-03 DIAGNOSIS — F320 Major depressive disorder, single episode, mild: Secondary | ICD-10-CM | POA: Diagnosis not present

## 2022-07-03 DIAGNOSIS — F411 Generalized anxiety disorder: Secondary | ICD-10-CM | POA: Diagnosis not present

## 2022-07-09 DIAGNOSIS — F320 Major depressive disorder, single episode, mild: Secondary | ICD-10-CM | POA: Diagnosis not present

## 2022-07-09 DIAGNOSIS — F411 Generalized anxiety disorder: Secondary | ICD-10-CM | POA: Diagnosis not present

## 2022-07-12 DIAGNOSIS — F411 Generalized anxiety disorder: Secondary | ICD-10-CM | POA: Diagnosis not present

## 2022-07-12 DIAGNOSIS — F320 Major depressive disorder, single episode, mild: Secondary | ICD-10-CM | POA: Diagnosis not present

## 2022-07-16 DIAGNOSIS — F320 Major depressive disorder, single episode, mild: Secondary | ICD-10-CM | POA: Diagnosis not present

## 2022-07-16 DIAGNOSIS — F411 Generalized anxiety disorder: Secondary | ICD-10-CM | POA: Diagnosis not present

## 2022-07-31 DIAGNOSIS — F320 Major depressive disorder, single episode, mild: Secondary | ICD-10-CM | POA: Diagnosis not present

## 2022-07-31 DIAGNOSIS — F411 Generalized anxiety disorder: Secondary | ICD-10-CM | POA: Diagnosis not present

## 2022-08-09 ENCOUNTER — Encounter: Payer: Self-pay | Admitting: Family Medicine

## 2022-08-09 ENCOUNTER — Ambulatory Visit: Payer: BC Managed Care – PPO | Admitting: Family Medicine

## 2022-08-09 VITALS — BP 120/76 | HR 97 | Temp 97.6°F | Wt 119.8 lb

## 2022-08-09 DIAGNOSIS — L02412 Cutaneous abscess of left axilla: Secondary | ICD-10-CM | POA: Diagnosis not present

## 2022-08-09 MED ORDER — DOXYCYCLINE HYCLATE 100 MG PO TABS: 100.0000 mg | ORAL_TABLET | Freq: Two times a day (BID) | ORAL | 0 refills | Status: AC

## 2022-08-09 NOTE — Patient Instructions (Signed)
May take doxycyline if no improvement in 24 hours.   Use OTC ibuprofen or tylenol as needed for pain.

## 2022-08-09 NOTE — Progress Notes (Signed)
Assessment/Plan:   Problem List Items Addressed This Visit   None Visit Diagnoses     Abscess of left axilla    -  Primary   Relevant Medications   doxycycline (VIBRA-TABS) 100 MG tablet          Subjective:  HPI:  Dana Pace is a 51 y.o. female who has Routine general medical examination at a health care facility; Hypothyroidism; Migraine headache without aura; RUQ pain; Simple hepatic cyst; and Hepatic cyst on their problem list..   She  has a past medical history of Anxiety, Blood transfusion without reported diagnosis, Hashimoto's disease, Headache, Hepatic cyst, migraine headaches, Hypertension, Hypothyroidism, PONV (postoperative nausea and vomiting), and Thyroid disease..   She presents with chief complaint of Skin concern (Left armpit  skin irritation, itchiness and painful to touch x Tuesday. ) .    Location: Left axilla  Informed Consent: Discussed risks (permanent scarring, light or dark discoloration, infection, pain, bleeding, bruising, redness, damage to adjacent structures, and recurrence of the lesion) and benefits of the procedure, as well as the alternatives.  Informed consent was obtained.  Preparation: The area was prepped with alcohol.  Anesthesia: Lidocaine 1% with epinephrine  Procedure Details: An incision was made overlying the lesion. The lesion drained pus.  A small amount of fluid was drained.    Antibiotic ointment and a sterile pressure dressing were applied. The patient tolerated procedure well.  Total number of lesions drained: 1  Plan: The patient was instructed on post-op care. Recommend OTC analgesia as needed for pain.  Past Surgical History:  Procedure Laterality Date   ABDOMINAL HYSTERECTOMY     BREAST BIOPSY Right 07/09/2017   benign   COLONOSCOPY     DILATION AND CURETTAGE OF UTERUS     x2   LAPAROSCOPIC PARTIAL HEPATECTOMY N/A 02/28/2022   Procedure: LAPAROSCOPIC FENESTRATION OF HEPATIC CYSTS;  Surgeon: [name], MD;  Location: [place] [facility];  Service: General;  Laterality: N/A;   WISDOM TOOTH EXTRACTION      Outpatient Medications Prior to Visit  Medication Sig Dispense Refill   acetaminophen (TYLENOL) 500 MG tablet Take 2 tablets (1,000 mg total) by mouth every 8 (eight) hours as needed for mild pain. 30 tablet 0   cholecalciferol (VITAMIN D3) 25 MCG (1000 UNIT) tablet Take 1,000 Units by mouth daily.     Cyanocobalamin (VITAMIN B12 PO) Take 1 tablet by mouth daily.     cyclobenzaprine (FLEXERIL) 5 MG tablet Take 1 tablet (5 mg total) by mouth at bedtime as needed for muscle spasms. 30 tablet 1   escitalopram (LEXAPRO) 20 MG tablet Take 1 tablet (20 mg total) by mouth daily. 90 tablet 3   levothyroxine (SYNTHROID) 112 MCG tablet TAKE 1 TABLET(112 MCG) BY MOUTH DAILY BEFORE BREAKFAST 90 tablet 3   LORazepam (ATIVAN) 0.5 MG tablet Take 0.5 mg by mouth daily as needed for anxiety.     methocarbamol (ROBAXIN) 500 MG tablet Take 1 tablet (500 mg total) by mouth every 6 (six) hours as needed for muscle spasms. 20 tablet 0   ondansetron (ZOFRAN) 4 MG tablet Take 1 tablet (4 mg total) by mouth every 8 (eight) hours as needed for nausea or vomiting. 20 tablet 0   docusate sodium (COLACE) 100 MG capsule Take 1 capsule (100 mg total) by mouth 2 (two) times daily. (Patient not taking: Reported on 08/09/2022) 14 capsule 0   famotidine (PEPCID) 20 MG tablet Take 20 mg by mouth daily. (Patient not  taking: Reported on 08/09/2022)     FIBER PO Take 2 capsules by mouth daily at 6 (six) AM. (Patient not taking: Reported on 08/09/2022)     No facility-administered medications prior to visit.    Family History  Problem Relation Age of Onset   Anuerysm Mother    Mental illness Father    Thyroid disease Sister    Hyperlipidemia Sister    Other Sister        prediabetes   Mental illness Paternal Uncle    Alcohol abuse Paternal Uncle    Uterine cancer Maternal Grandmother    Hyperlipidemia Maternal Grandmother     Hypertension Maternal Grandmother    Diabetes Maternal Grandmother    Lung cancer Maternal Grandfather    Stomach cancer Paternal Grandmother        ?   Cancer Paternal Grandfather        origin unknown mets to brain   ADD / ADHD Daughter    Post-traumatic stress disorder Daughter        boderline personality   Anxiety disorder Daughter    Depression Daughter    Bipolar disorder Daughter    Autism Daughter    Depression Daughter    Breast cancer Neg Hx    Colon polyps Neg Hx    Colon cancer Neg Hx    Esophageal cancer Neg Hx    Rectal cancer Neg Hx     Social History   Socioeconomic History   Marital status: Married    Spouse name: Not on file   Number of children: Not on file   Years of education: Not on file   Highest education level: Not on file  Occupational History   Not on file  Tobacco Use   Smoking status: Former   Smokeless tobacco: Never  Vaping Use   Vaping Use: Never used  Substance and Sexual Activity   Alcohol use: Yes    Alcohol/week: 2.0 standard drinks of alcohol    Types: 2 Standard drinks or equivalent per week    Comment: 2-3 drinks per month (02/25/22)   Drug use: Yes    Types: Marijuana    Comment: twice a year   Sexual activity: Not on file  Other Topics Concern   Not on file  Social History Narrative   Not on file   Social Determinants of Health   Financial Resource Strain: Not on file  Food Insecurity: Not on file  Transportation Needs: Not on file  Physical Activity: Not on file  Stress: Not on file  Social Connections: Not on file  Intimate Partner Violence: Not on file                                                                                                 Objective:  Physical Exam: BP 120/76 (BP Location: Left Arm, Patient Position: Sitting, Cuff Size: Large)   Pulse 97   Temp 97.6 F (36.4 C) (Temporal)   Wt 119 lb 12.8 oz (54.3 kg)   SpO2 99%   BMI 21.56 kg/m  Alesia Banda, MD, MS

## 2022-09-03 DIAGNOSIS — F411 Generalized anxiety disorder: Secondary | ICD-10-CM | POA: Diagnosis not present

## 2022-09-03 DIAGNOSIS — F320 Major depressive disorder, single episode, mild: Secondary | ICD-10-CM | POA: Diagnosis not present

## 2022-09-10 DIAGNOSIS — F411 Generalized anxiety disorder: Secondary | ICD-10-CM | POA: Diagnosis not present

## 2022-09-10 DIAGNOSIS — F320 Major depressive disorder, single episode, mild: Secondary | ICD-10-CM | POA: Diagnosis not present

## 2022-09-11 ENCOUNTER — Ambulatory Visit: Payer: BC Managed Care – PPO

## 2022-09-11 ENCOUNTER — Ambulatory Visit
Admission: RE | Admit: 2022-09-11 | Discharge: 2022-09-11 | Disposition: A | Discharge status: Home / Self Care | Payer: BC Managed Care – PPO | Source: Ambulatory Visit | Attending: Internal Medicine | Admitting: Internal Medicine

## 2022-09-11 DIAGNOSIS — R922 Inconclusive mammogram: Secondary | ICD-10-CM | POA: Diagnosis not present

## 2022-09-11 DIAGNOSIS — N6489 Other specified disorders of breast: Secondary | ICD-10-CM

## 2022-09-12 LAB — HM MAMMOGRAPHY

## 2022-09-19 DIAGNOSIS — F411 Generalized anxiety disorder: Secondary | ICD-10-CM | POA: Diagnosis not present

## 2022-09-19 DIAGNOSIS — F320 Major depressive disorder, single episode, mild: Secondary | ICD-10-CM | POA: Diagnosis not present

## 2022-10-29 DIAGNOSIS — F320 Major depressive disorder, single episode, mild: Secondary | ICD-10-CM | POA: Diagnosis not present

## 2022-10-29 DIAGNOSIS — F411 Generalized anxiety disorder: Secondary | ICD-10-CM | POA: Diagnosis not present

## 2022-11-13 DIAGNOSIS — F320 Major depressive disorder, single episode, mild: Secondary | ICD-10-CM | POA: Diagnosis not present

## 2022-11-13 DIAGNOSIS — F411 Generalized anxiety disorder: Secondary | ICD-10-CM | POA: Diagnosis not present

## 2022-11-20 DIAGNOSIS — F320 Major depressive disorder, single episode, mild: Secondary | ICD-10-CM | POA: Diagnosis not present

## 2022-11-20 DIAGNOSIS — F411 Generalized anxiety disorder: Secondary | ICD-10-CM | POA: Diagnosis not present

## 2022-12-03 DIAGNOSIS — F320 Major depressive disorder, single episode, mild: Secondary | ICD-10-CM | POA: Diagnosis not present

## 2022-12-03 DIAGNOSIS — F411 Generalized anxiety disorder: Secondary | ICD-10-CM | POA: Diagnosis not present

## 2022-12-12 DIAGNOSIS — F320 Major depressive disorder, single episode, mild: Secondary | ICD-10-CM | POA: Diagnosis not present

## 2022-12-12 DIAGNOSIS — F411 Generalized anxiety disorder: Secondary | ICD-10-CM | POA: Diagnosis not present

## 2022-12-18 DIAGNOSIS — F320 Major depressive disorder, single episode, mild: Secondary | ICD-10-CM | POA: Diagnosis not present

## 2022-12-18 DIAGNOSIS — F411 Generalized anxiety disorder: Secondary | ICD-10-CM | POA: Diagnosis not present

## 2023-01-14 DIAGNOSIS — F320 Major depressive disorder, single episode, mild: Secondary | ICD-10-CM | POA: Diagnosis not present

## 2023-01-14 DIAGNOSIS — F411 Generalized anxiety disorder: Secondary | ICD-10-CM | POA: Diagnosis not present

## 2023-01-30 DIAGNOSIS — F411 Generalized anxiety disorder: Secondary | ICD-10-CM | POA: Diagnosis not present

## 2023-01-30 DIAGNOSIS — F320 Major depressive disorder, single episode, mild: Secondary | ICD-10-CM | POA: Diagnosis not present

## 2023-02-18 DIAGNOSIS — F320 Major depressive disorder, single episode, mild: Secondary | ICD-10-CM | POA: Diagnosis not present

## 2023-02-18 DIAGNOSIS — F411 Generalized anxiety disorder: Secondary | ICD-10-CM | POA: Diagnosis not present

## 2023-03-03 ENCOUNTER — Telehealth: Payer: Self-pay | Admitting: Internal Medicine

## 2023-03-03 MED ORDER — LEVOTHYROXINE SODIUM 112 MCG PO TABS: ORAL_TABLET | ORAL | 0 refills | Status: DC

## 2023-03-03 NOTE — Telephone Encounter (Signed)
Prescription Request  03/03/2023  LOV: Visit date not found  What is the name of the medication or equipment? Levothyrozine   Have you contacted your pharmacy to request a refill? Yes   Which pharmacy would you like this sent to?  [id number] [place] [address], [place] - [address] AT [facility] [address] & [address] [place] [address] Phone: [phone] Fax: [phone]    Patient notified that their request is being sent to the clinical staff for review and that they should receive a response within 2 business days.   Please advise at Mobile [phone] (mobile)

## 2023-03-03 NOTE — Telephone Encounter (Signed)
Pt made appt for 03/17/23 sent 30 to [facility] due to office policy until appt ...lmb

## 2023-03-11 DIAGNOSIS — F411 Generalized anxiety disorder: Secondary | ICD-10-CM | POA: Diagnosis not present

## 2023-03-11 DIAGNOSIS — F320 Major depressive disorder, single episode, mild: Secondary | ICD-10-CM | POA: Diagnosis not present

## 2023-03-17 ENCOUNTER — Encounter: Payer: Self-pay | Admitting: Internal Medicine

## 2023-03-17 ENCOUNTER — Ambulatory Visit (INDEPENDENT_AMBULATORY_CARE_PROVIDER_SITE_OTHER): Payer: BC Managed Care – PPO | Admitting: Internal Medicine

## 2023-03-17 VITALS — BP 120/80 | HR 67 | Temp 98.5°F | Ht 62.5 in | Wt 130.0 lb

## 2023-03-17 DIAGNOSIS — Z0000 Encounter for general adult medical examination without abnormal findings: Secondary | ICD-10-CM

## 2023-03-17 DIAGNOSIS — F4323 Adjustment disorder with mixed anxiety and depressed mood: Secondary | ICD-10-CM

## 2023-03-17 DIAGNOSIS — Z13220 Encounter for screening for lipoid disorders: Secondary | ICD-10-CM | POA: Diagnosis not present

## 2023-03-17 DIAGNOSIS — E039 Hypothyroidism, unspecified: Secondary | ICD-10-CM | POA: Diagnosis not present

## 2023-03-17 DIAGNOSIS — R1011 Right upper quadrant pain: Secondary | ICD-10-CM

## 2023-03-17 DIAGNOSIS — F4320 Adjustment disorder, unspecified: Secondary | ICD-10-CM | POA: Insufficient documentation

## 2023-03-17 DIAGNOSIS — Z23 Encounter for immunization: Secondary | ICD-10-CM

## 2023-03-17 DIAGNOSIS — Z1159 Encounter for screening for other viral diseases: Secondary | ICD-10-CM

## 2023-03-17 DIAGNOSIS — Z0001 Encounter for general adult medical examination with abnormal findings: Secondary | ICD-10-CM

## 2023-03-17 DIAGNOSIS — K7689 Other specified diseases of liver: Secondary | ICD-10-CM

## 2023-03-17 LAB — CBC
HCT: 39.6 % (ref 36.0–46.0)
Hemoglobin: 13.1 g/dL (ref 12.0–15.0)
MCHC: 33 g/dL (ref 30.0–36.0)
MCV: 89.7 fl (ref 78.0–100.0)
Platelets: 233 10*3/uL (ref 150.0–400.0)
RBC: 4.41 Mil/uL (ref 3.87–5.11)
RDW: 13.1 % (ref 11.5–15.5)
WBC: 6.6 10*3/uL (ref 4.0–10.5)

## 2023-03-17 LAB — LIPID PANEL
Cholesterol: 264 mg/dL — ABNORMAL HIGH (ref 0–200)
HDL: 53 mg/dL (ref >39.00)
NonHDL: 211.08
Total CHOL/HDL Ratio: 5
Triglycerides: 235 mg/dL — ABNORMAL HIGH (ref 0.0–149.0)
VLDL: 47 mg/dL — ABNORMAL HIGH (ref 0.0–40.0)

## 2023-03-17 LAB — COMPREHENSIVE METABOLIC PANEL
ALT: 32 U/L (ref 0–35)
AST: 25 U/L (ref 0–37)
Albumin: 4.5 g/dL (ref 3.5–5.2)
Alkaline Phosphatase: 81 U/L (ref 39–117)
BUN: 10 mg/dL (ref 6–23)
CO2: 28 mEq/L (ref 19–32)
Calcium: 9.8 mg/dL (ref 8.4–10.5)
Chloride: 103 mEq/L (ref 96–112)
Creatinine, Ser: 0.76 mg/dL (ref 0.40–1.20)
GFR: 91.44 mL/min (ref >60.00)
Glucose, Bld: 103 mg/dL — ABNORMAL HIGH (ref 70–99)
Potassium: 3.9 mEq/L (ref 3.5–5.1)
Sodium: 138 mEq/L (ref 135–145)
Total Bilirubin: 0.4 mg/dL (ref 0.2–1.2)
Total Protein: 7.6 g/dL (ref 6.0–8.3)

## 2023-03-17 LAB — TSH: TSH: 31.84 u[IU]/mL — ABNORMAL HIGH (ref 0.35–5.50)

## 2023-03-17 LAB — LDL CHOLESTEROL, DIRECT: Direct LDL: 178 mg/dL

## 2023-03-17 MED ORDER — ESCITALOPRAM OXALATE 20 MG PO TABS
20.0000 mg | ORAL_TABLET | Freq: Every day | ORAL | 3 refills | Status: DC
Start: 2023-03-17 — End: 2024-03-24

## 2023-03-17 MED ORDER — LEVOTHYROXINE SODIUM 112 MCG PO TABS: 112.0000 ug | ORAL_TABLET | Freq: Every day | ORAL | 3 refills | Status: DC

## 2023-03-17 MED ORDER — ONDANSETRON HCL 4 MG PO TABS: 4.0000 mg | ORAL_TABLET | Freq: Three times a day (TID) | ORAL | 3 refills | Status: DC | PRN

## 2023-03-17 NOTE — Assessment & Plan Note (Signed)
Checking TSH and adjust synthroid 112 mcg daily as needed.  ?

## 2023-03-17 NOTE — Assessment & Plan Note (Signed)
Flu shot yearly. Shingrix given 1st. Tetanus up to date. Colonoscopy up to date. Mammogram up to date, pap smear up to date. Counseled about sun safety and mole surveillance. Counseled about the dangers of distracted driving. Given 10 year screening recommendations.

## 2023-03-17 NOTE — Progress Notes (Signed)
   Subjective:   Patient ID: Dana Pace, female    DOB: 26-Aug-1971, 52 y.o.   MRN: [id number]  HPI The patient is here for physical. With acute concerns as well about new  RUQ pain and nausea last 6 weeks.   PMH, [facility], social history reviewed and updated  Review of Systems  Constitutional: Negative.   HENT: Negative.    Eyes: Negative.   Respiratory:  Negative for cough, chest tightness and shortness of breath.   Cardiovascular:  Negative for chest pain, palpitations and leg swelling.  Gastrointestinal:  Positive for abdominal pain and nausea. Negative for abdominal distention, constipation, diarrhea and vomiting.  Musculoskeletal: Negative.   Skin: Negative.   Neurological: Negative.   Psychiatric/Behavioral: Negative.      Objective:  Physical Exam Constitutional:      Appearance: She is well-developed.  HENT:     Head: Normocephalic and atraumatic.  Cardiovascular:     Rate and Rhythm: Normal rate and regular rhythm.  Pulmonary:     Effort: Pulmonary effort is normal. No respiratory distress.     Breath sounds: Normal breath sounds. No wheezing or rales.  Abdominal:     General: Bowel sounds are normal. There is no distension.     Palpations: Abdomen is soft.     Tenderness: There is abdominal tenderness. There is no rebound.     Comments: Pain RUQ  Musculoskeletal:     Cervical back: Normal range of motion.  Skin:    General: Skin is warm and dry.  Neurological:     Mental Status: She is alert and oriented to person, place, and time.     Coordination: Coordination normal.     Vitals:   03/17/23 1341  BP: 120/80  Pulse: 67  Temp: 98.5 F (36.9 C)  TempSrc: Oral  SpO2: 97%  Weight: 130 lb (59 kg)  Height: 5' 2.5" (1.588 m)    Assessment & Plan:  Shingrix IM given at visit

## 2023-03-17 NOTE — Assessment & Plan Note (Signed)
Relief from symptoms for about 11 months after cystectomy last year. She is having new abdominal pain and nausea similar to prior cysts last 6 weeks. Ordered CMP, CBC, RUQ [place] stat today. She has met deductible so if procedure needed she prefers to have done prior to Nov 1st if possible.

## 2023-03-17 NOTE — Assessment & Plan Note (Signed)
Taking lexapro 20 mg daily and adequate control. Dealing with SI in child and this is stressful.

## 2023-03-17 NOTE — Patient Instructions (Signed)
We will check the ultrasound of the liver.

## 2023-03-17 NOTE — Assessment & Plan Note (Signed)
New in last 6 weeks and suspect recurrent cyst. Ordered CMP and [place] RUQ stat.

## 2023-03-18 ENCOUNTER — Ambulatory Visit: Admission: RE | Admit: 2023-03-18 | Payer: BC Managed Care – PPO | Source: Ambulatory Visit

## 2023-03-18 DIAGNOSIS — R1011 Right upper quadrant pain: Secondary | ICD-10-CM | POA: Diagnosis not present

## 2023-03-18 DIAGNOSIS — K828 Other specified diseases of gallbladder: Secondary | ICD-10-CM | POA: Diagnosis not present

## 2023-03-18 DIAGNOSIS — K7689 Other specified diseases of liver: Secondary | ICD-10-CM

## 2023-03-18 LAB — HEPATITIS C ANTIBODY: Hepatitis C Ab: NONREACTIVE

## 2023-03-18 LAB — HIV ANTIBODY (ROUTINE TESTING W REFLEX): HIV 1&2 Ab, 4th Generation: NONREACTIVE

## 2023-03-19 DIAGNOSIS — F320 Major depressive disorder, single episode, mild: Secondary | ICD-10-CM | POA: Diagnosis not present

## 2023-03-19 DIAGNOSIS — F411 Generalized anxiety disorder: Secondary | ICD-10-CM | POA: Diagnosis not present

## 2023-03-20 ENCOUNTER — Other Ambulatory Visit: Payer: Self-pay | Admitting: Nurse Practitioner

## 2023-03-20 DIAGNOSIS — E039 Hypothyroidism, unspecified: Secondary | ICD-10-CM

## 2023-03-20 MED ORDER — LEVOTHYROXINE SODIUM 125 MCG PO TABS
125.0000 ug | ORAL_TABLET | Freq: Every day | ORAL | 0 refills | Status: DC
Start: 2023-03-20 — End: 2023-06-27

## 2023-04-08 DIAGNOSIS — F411 Generalized anxiety disorder: Secondary | ICD-10-CM | POA: Diagnosis not present

## 2023-04-08 DIAGNOSIS — F320 Major depressive disorder, single episode, mild: Secondary | ICD-10-CM | POA: Diagnosis not present

## 2023-04-22 DIAGNOSIS — F411 Generalized anxiety disorder: Secondary | ICD-10-CM | POA: Diagnosis not present

## 2023-04-22 DIAGNOSIS — F320 Major depressive disorder, single episode, mild: Secondary | ICD-10-CM | POA: Diagnosis not present

## 2023-05-01 ENCOUNTER — Ambulatory Visit (INDEPENDENT_AMBULATORY_CARE_PROVIDER_SITE_OTHER): Payer: BC Managed Care – PPO | Admitting: Podiatry

## 2023-05-01 DIAGNOSIS — Z91199 Patient's noncompliance with other medical treatment and regimen due to unspecified reason: Secondary | ICD-10-CM

## 2023-05-01 NOTE — Progress Notes (Signed)
No show for apt.

## 2023-05-02 ENCOUNTER — Ambulatory Visit: Payer: BC Managed Care – PPO | Admitting: Internal Medicine

## 2023-05-02 VITALS — BP 126/80 | HR 60 | Temp 98.3°F | Ht 62.5 in | Wt 132.0 lb

## 2023-05-02 DIAGNOSIS — F4323 Adjustment disorder with mixed anxiety and depressed mood: Secondary | ICD-10-CM

## 2023-05-02 DIAGNOSIS — R1011 Right upper quadrant pain: Secondary | ICD-10-CM

## 2023-05-02 DIAGNOSIS — E782 Mixed hyperlipidemia: Secondary | ICD-10-CM | POA: Diagnosis not present

## 2023-05-02 DIAGNOSIS — K7689 Other specified diseases of liver: Secondary | ICD-10-CM | POA: Diagnosis not present

## 2023-05-02 DIAGNOSIS — E785 Hyperlipidemia, unspecified: Secondary | ICD-10-CM | POA: Insufficient documentation

## 2023-05-02 DIAGNOSIS — E039 Hypothyroidism, unspecified: Secondary | ICD-10-CM

## 2023-05-02 LAB — FOLLICLE STIMULATING HORMONE: FSH: 4.7 m[IU]/mL

## 2023-05-02 LAB — T4, FREE: Free T4: 1.14 ng/dL (ref 0.60–1.60)

## 2023-05-02 LAB — TSH: TSH: 1 u[IU]/mL (ref 0.35–5.50)

## 2023-05-02 NOTE — Assessment & Plan Note (Signed)
Worse lately and concern about menopause. S/P hysterectomy so unable to use last period to tell. Checking FSH.

## 2023-05-02 NOTE — Progress Notes (Signed)
   Subjective:   Patient ID: Dana Pace, female    DOB: [birth year], 52 y.o.   MRN: [id number]  HPI The patient is a 52 YO female coming in for ongoing RUQ pain feels same as hepatic cysts. Also off thyroid levels and more mood swings concerned about menopause.  Review of Systems  Constitutional: Negative.   HENT: Negative.    Eyes: Negative.   Respiratory:  Negative for cough, chest tightness and shortness of breath.   Cardiovascular:  Negative for chest pain, palpitations and leg swelling.  Gastrointestinal:  Positive for abdominal pain. Negative for abdominal distention, constipation, diarrhea, nausea and vomiting.  Musculoskeletal: Negative.   Skin: Negative.   Neurological: Negative.   Psychiatric/Behavioral:  Positive for dysphoric mood.     Objective:  Physical Exam Constitutional:      Appearance: She is well-developed.  HENT:     Head: Normocephalic and atraumatic.  Cardiovascular:     Rate and Rhythm: Normal rate and regular rhythm.  Pulmonary:     Effort: Pulmonary effort is normal. No respiratory distress.     Breath sounds: Normal breath sounds. No wheezing or rales.  Abdominal:     General: Bowel sounds are normal. There is no distension.     Palpations: Abdomen is soft.     Tenderness: There is abdominal tenderness. There is no rebound.  Musculoskeletal:     Cervical back: Normal range of motion.  Skin:    General: Skin is warm and dry.  Neurological:     Mental Status: She is alert and oriented to person, place, and time.     Coordination: Coordination normal.     Vitals:   05/02/23 0825  BP: 126/80  Pulse: 60  Temp: 98.3 F (36.8 C)  TempSrc: Oral  SpO2: 99%  Weight: 132 lb (59.9 kg)  Height: 5' 2.5" (1.588 m)    Assessment & Plan:

## 2023-05-02 NOTE — Patient Instructions (Signed)
We will check the labs and get you back in with surgeon.

## 2023-05-02 NOTE — Assessment & Plan Note (Signed)
She has concerns about going through menopause and we discussed the negative impacts on cholesterol menopause can have. She does not have evidence for fatty liver disease on recent US RUQ. She has poor diet and limited exercise she will work on this and recheck lipid panel 3-6 months.

## 2023-05-02 NOTE — Assessment & Plan Note (Signed)
Causing pain, reviewed [place] results with her and size is much smaller of largest lesion that last year. Largest is about 3.1 cm. Refer back to general surgery and she has scheduled follow up with GI 10/1 already.

## 2023-05-02 NOTE — Assessment & Plan Note (Signed)
Likely due to hepatic cysts. We discussed some gallbladder sludge but she does not have typical symptoms for that. She is having constant pain with worse with palpation and lying on it. She does not have symptoms after meals. We discussed cutting back on fat in diet to help cholesterol levels.

## 2023-05-02 NOTE — Assessment & Plan Note (Signed)
Recent dosage change to 125 mcg daily due to high TSH. She was taking biotin. Checking TSH and free T4 today and adjust levothyroxine 125 mcg daily as needed. She had not missed doses or changed anything prior to last labs.

## 2023-05-13 ENCOUNTER — Ambulatory Visit: Payer: BC Managed Care – PPO | Admitting: Internal Medicine

## 2023-05-21 DIAGNOSIS — F411 Generalized anxiety disorder: Secondary | ICD-10-CM | POA: Diagnosis not present

## 2023-05-21 DIAGNOSIS — F320 Major depressive disorder, single episode, mild: Secondary | ICD-10-CM | POA: Diagnosis not present

## 2023-05-30 ENCOUNTER — Ambulatory Visit (INDEPENDENT_AMBULATORY_CARE_PROVIDER_SITE_OTHER): Payer: BC Managed Care – PPO

## 2023-05-30 ENCOUNTER — Ambulatory Visit (HOSPITAL_COMMUNITY)
Admission: EM | Admit: 2023-05-30 | Discharge: 2023-05-30 | Disposition: A | Discharge status: Home / Self Care | Payer: BC Managed Care – PPO | Attending: Emergency Medicine | Admitting: Emergency Medicine

## 2023-05-30 ENCOUNTER — Encounter (HOSPITAL_COMMUNITY): Payer: Self-pay | Admitting: *Deleted

## 2023-05-30 DIAGNOSIS — S20212A Contusion of left front wall of thorax, initial encounter: Secondary | ICD-10-CM | POA: Diagnosis not present

## 2023-05-30 DIAGNOSIS — R0781 Pleurodynia: Secondary | ICD-10-CM | POA: Diagnosis not present

## 2023-05-30 NOTE — ED Provider Notes (Signed)
[facility]    CSN: [id number] Arrival date & time: 05/30/23  1509      History   Chief Complaint Chief Complaint  Patient presents with   Rib Injury    HPI Dana Pace is a 52 y.o. female.   Patient presents to clinic for complaint of left sided anterior rib cage pain. She was trying to break down a door on Monday and hit her anterior left side rib cage. She has been taking Tylenol and using a muscle relaxer without much help.   Pain with deep breathing. Denies shortness of breath or wheezing.   Hx of borderline low Vitamin D.   The history is provided by the patient and medical records.    Past Medical History:  Diagnosis Date   Anxiety    Blood transfusion without reported diagnosis    Hashimoto's disease    Headache    Hepatic cyst    Hx of migraine headaches    Hypertension    controlled   Hypothyroidism    PONV (postoperative nausea and vomiting)    trouble waking up after colonoscopy   Thyroid disease     Patient Active Problem List   Diagnosis Date Noted   Hyperlipidemia 05/02/2023   Adjustment disorder 03/17/2023   Hepatic cyst 02/28/2022   RUQ pain 06/28/2021   Migraine headache without aura 04/01/2017   Routine general medical examination at a health care facility 10/06/2015   Hypothyroidism 10/06/2015    Past Surgical History:  Procedure Laterality Date   ABDOMINAL HYSTERECTOMY     BREAST BIOPSY Right 07/09/2017   benign   COLONOSCOPY     DILATION AND CURETTAGE OF UTERUS     x2   LAPAROSCOPIC PARTIAL HEPATECTOMY N/A 02/28/2022   Procedure: LAPAROSCOPIC FENESTRATION OF HEPATIC CYSTS;  Surgeon: Fritzi Mandes, MD;  Location: [facility] OR;  Service: General;  Laterality: N/A;   WISDOM TOOTH EXTRACTION      OB History   No obstetric history on file.      Home Medications    Prior to Admission medications   Medication Sig Start Date End Date Taking? Authorizing Provider  cyclobenzaprine (FLEXERIL) 5 MG tablet Take 1 tablet  (5 mg total) by mouth at bedtime as needed for muscle spasms. 08/04/18  Yes Olive Bass, FNP  escitalopram (LEXAPRO) 20 MG tablet Take 1 tablet (20 mg total) by mouth daily. 03/17/23  Yes Myrlene Broker, MD  famotidine (PEPCID) 20 MG tablet Take 20 mg by mouth daily.   Yes [provider]  levothyroxine (SYNTHROID) 125 MCG tablet Take 1 tablet (125 mcg total) by mouth daily before breakfast. TAKE 1 TABLET(112 MCG) BY MOUTH DAILY BEFORE BREAKFAST 03/20/23  Yes Elenore Paddy, NP  LORazepam (ATIVAN) 0.5 MG tablet Take 0.5 mg by mouth daily as needed for anxiety. 02/02/21   [provider]  methocarbamol (ROBAXIN) 500 MG tablet Take 1 tablet (500 mg total) by mouth every 6 (six) hours as needed for muscle spasms. 03/01/22   Fritzi Mandes, MD  ondansetron (ZOFRAN) 4 MG tablet Take 1 tablet (4 mg total) by mouth every 8 (eight) hours as needed for nausea or vomiting. 03/17/23   Myrlene Broker, MD    Family History Family History  Problem Relation Age of Onset   Anuerysm Mother    Mental illness Father    Thyroid disease Sister    Hyperlipidemia Sister    Other Sister  prediabetes   Mental illness Paternal Uncle    Alcohol abuse Paternal Uncle    Uterine cancer Maternal Grandmother    Hyperlipidemia Maternal Grandmother    Hypertension Maternal Grandmother    Diabetes Maternal Grandmother    Lung cancer Maternal Grandfather    Stomach cancer Paternal Grandmother        ?   Cancer Paternal Grandfather        origin unknown mets to brain   ADD / ADHD Daughter    Post-traumatic stress disorder Daughter        boderline personality   Anxiety disorder Daughter    Depression Daughter    Bipolar disorder Daughter    Autism Daughter    Depression Daughter    Breast cancer Neg Hx    Colon polyps Neg Hx    Colon cancer Neg Hx    Esophageal cancer Neg Hx    Rectal cancer Neg Hx     Social History Social History   Tobacco Use   Smoking status:  Former   Smokeless tobacco: Never  Vaping Use   Vaping status: Never Used  Substance Use Topics   Alcohol use: Yes    Alcohol/week: 2.0 standard drinks of alcohol    Types: 2 Standard drinks or equivalent per week    Comment: 2-3 drinks per month (02/25/22)   Drug use: Yes    Types: Marijuana    Comment: twice a year     Allergies   Augmentin [amoxicillin-pot clavulanate] and Prednisone   Review of Systems Review of Systems  Reason unable to perform ROS: per HPI.     Physical Exam Triage Vital Signs ED Triage Vitals  Encounter Vitals Group     BP 05/30/23 1538 (!) 155/93     Systolic BP Percentile --      Diastolic BP Percentile --      Pulse Rate 05/30/23 1538 (!) 57     Resp 05/30/23 1538 18     Temp 05/30/23 1538 98.6 F (37 C)     Temp Source 05/30/23 1538 Oral     SpO2 05/30/23 1538 99 %     Weight --      Height --      Head Circumference --      Peak Flow --      Pain Score 05/30/23 1537 5     Pain Loc --      Pain Education --      Exclude from Growth Chart --    No data found.  Updated Vital Signs BP (!) 155/93 (BP Location: Right Arm)   Pulse (!) 57   Temp 98.6 F (37 C) (Oral)   Resp 18   SpO2 99%   Visual Acuity Right Eye Distance:   Left Eye Distance:   Bilateral Distance:    Right Eye Near:   Left Eye Near:    Bilateral Near:     Physical Exam Vitals and nursing note reviewed.  Constitutional:      Appearance: Normal appearance.  HENT:     Right Ear: External ear normal.     Left Ear: External ear normal.     Nose: Nose normal.     Mouth/Throat:     Mouth: Mucous membranes are moist.  Eyes:     Conjunctiva/sclera: Conjunctivae normal.  Cardiovascular:     Rate and Rhythm: Normal rate and regular rhythm.     Heart sounds: Normal heart sounds. No murmur heard. Pulmonary:  Effort: Pulmonary effort is normal. No respiratory distress.     Breath sounds: Normal breath sounds.  Chest:     Chest wall: Tenderness present. No  mass, lacerations, deformity, swelling or crepitus.       Comments: Left anterior rib pain to palpation along 6th and 7th ribs. No crepitus, bruising or deformity.  Musculoskeletal:        General: Normal range of motion.     Cervical back: Normal range of motion.  Skin:    General: Skin is warm and dry.  Neurological:     General: No focal deficit present.     Mental Status: She is alert and oriented to person, place, and time.  Psychiatric:        Mood and Affect: Mood normal.        Behavior: Behavior normal. Behavior is cooperative.      UC Treatments / Results  Labs (all labs ordered are listed, but only abnormal results are displayed) Labs Reviewed - No data to display  EKG   Radiology No results found.  Procedures Procedures (including critical care time)  Medications Ordered in UC Medications - No data to display  Initial Impression / Assessment and Plan / UC Course  I have reviewed the triage vital signs and the nursing notes.  Pertinent labs & imaging results that were available during my care of the patient were reviewed by me and considered in my medical decision making (see chart for details).  Vitals and triage reviewed, patient is hemodynamically stable.  Has left anterior rib cage tenderness to palpation along sixth and seventh rib cage.  No crepitus, ecchymosis or deformity noted.  Lungs are vesicular posteriorly, oxygenation 99% on room air.  Imaging does not show any obvious displaced fractures, awaiting official radiology overread.  Symptomatic management for potential nondisplaced rib fractures/rib contusion discussed.  Incentive spirometer given in clinic.  Plan of care, follow-up care return precautions given, no questions at this time.     Final Clinical Impressions(s) / UC Diagnoses   Final diagnoses:  Contusion of rib on left side, initial encounter     Discharge Instructions      You do not have any displaced rib fractures.  You have  injured your rib cage, this can take weeks to heal.  Please alternate between 500 mg of Tylenol and 800 mg of ibuprofen every 4-6 hours for pain and discomfort.  For coughing and sneezing please splint your rib cage.  Please use the incentive spirometer a few times an hour to promote deep breathing.  If your pain persist beyond the next few weeks despite these interventions, please follow-up with an orthopedic for further evaluation.  Seek immediate care if you develop shortness of breath, trouble breathing, fever, or any new concerning symptoms.      ED Prescriptions   None    PDMP not reviewed this encounter.   Wrenly Lauritsen, [place], [place] 05/30/23 [phone]

## 2023-05-30 NOTE — Discharge Instructions (Addendum)
You do not have any displaced rib fractures.  You have injured your rib cage, this can take weeks to heal.  Please alternate between 500 mg of Tylenol and 800 mg of ibuprofen every 4-6 hours for pain and discomfort.  For coughing and sneezing please splint your rib cage.  Please use the incentive spirometer a few times an hour to promote deep breathing.  If your pain persist beyond the next few weeks despite these interventions, please follow-up with an orthopedic for further evaluation.  Seek immediate care if you develop shortness of breath, trouble breathing, fever, or any new concerning symptoms.

## 2023-05-30 NOTE — ED Triage Notes (Signed)
Pt states she was trying to break into a door and she hit it with her left shoulder Monday and since she has been having left rib pain. Pain is worse with deep breaths. She has been taking tylenol and muscle relaxer without relief.

## 2023-06-03 ENCOUNTER — Ambulatory Visit: Payer: BC Managed Care – PPO | Admitting: Internal Medicine

## 2023-06-03 ENCOUNTER — Encounter: Payer: Self-pay | Admitting: Internal Medicine

## 2023-06-03 ENCOUNTER — Ambulatory Visit (INDEPENDENT_AMBULATORY_CARE_PROVIDER_SITE_OTHER): Payer: BC Managed Care – PPO | Admitting: Internal Medicine

## 2023-06-03 VITALS — BP 122/64 | HR 65 | Ht 63.0 in | Wt 135.4 lb

## 2023-06-03 DIAGNOSIS — R0781 Pleurodynia: Secondary | ICD-10-CM

## 2023-06-03 DIAGNOSIS — K31A0 Gastric intestinal metaplasia, unspecified: Secondary | ICD-10-CM | POA: Diagnosis not present

## 2023-06-03 DIAGNOSIS — R1011 Right upper quadrant pain: Secondary | ICD-10-CM

## 2023-06-03 NOTE — Patient Instructions (Addendum)
You have been scheduled for an endoscopy. Please follow written instructions given to you at your visit today.  If you use inhalers (even only as needed), please bring them with you on the day of your procedure.  If you take any of the following medications, they will need to be adjusted prior to your procedure:   DO NOT TAKE 7 DAYS PRIOR TO TEST- Trulicity (dulaglutide) Ozempic, Wegovy (semaglutide) Mounjaro (tirzepatide) Bydureon Bcise (exanatide extended release)  DO NOT TAKE 1 DAY PRIOR TO YOUR TEST Rybelsus (semaglutide) Adlyxin (lixisenatide) Victoza (liraglutide) Byetta (exanatide)  If right sided pain continue contact office   If your blood pressure at your visit was 140/90 or greater, please contact your primary care physician to follow up on this.  _______________________________________________________  If you are age 18 or older, your body mass index should be between 23-30. Your Body mass index is 23.98 kg/m. If this is out of the aforementioned range listed, please consider follow up with your Primary Care Provider.  If you are age 41 or younger, your body mass index should be between 19-25. Your Body mass index is 23.98 kg/m. If this is out of the aformentioned range listed, please consider follow up with your Primary Care Provider.   ________________________________________________________  The [facility] providers would like to encourage you to use [facility] to communicate with providers for non-urgent requests or questions.  Due to long hold times on the telephone, sending your provider a message by [facility] may be a faster and more efficient way to get a response.  Please allow 48 business hours for a response.  Please remember that this is for non-urgent requests.  _______________________________________________________  Due to recent changes in healthcare laws, you may see the results of your imaging and laboratory studies on MyChart before your provider has had a  chance to review them.  We understand that in some cases there may be results that are confusing or concerning to you. Not all laboratory results come back in the same time frame and the provider may be waiting for multiple results in order to interpret others.  Please give [place] 48 hours in order for your provider to thoroughly review all the results before contacting the office for clarification of your results.    Thank you for entrusting me with your care and for choosing [facility], Dr. Eulah Pont

## 2023-06-03 NOTE — Progress Notes (Signed)
Chief Complaint: Hepatic cyst, RUQ ab pain  HPI : 52 year old female with history of anxiety, migraines, hypothyroidism, and HTN presents for follow up of RUQ ab pain  Interval History: Patient states that after her surgery for liver cyst in 01/2022, she has had significant improvement in her symptoms.  However, about 2 to 3 months ago she started developing right upper quadrant abdominal pain daily.  An abdominal ultrasound was done at that time that showed some small liver cysts (largest was 3 cm) as well as gallbladder sludge.  The pain felt like an ache and it was hard to sleep on her right side at that time.  This pain felt very similar to the pain that she had due to liver cysts in the past. The ab pain has improved.  She does not think that food or bowel movements made a difference in this pain.  Any pulling or pushing motion did make the pain worse.  A week ago she ran her left shoulder into a door and started developing left rib pain.  Denies chest burning or regurgitation.  Denies constipation.  She has diarrhea on occasion with on average 1 bowel movement per day.  Denies any blood in the stools.  Current Outpatient Medications  Medication Sig Dispense Refill   cyclobenzaprine (FLEXERIL) 5 MG tablet Take 1 tablet (5 mg total) by mouth at bedtime as needed for muscle spasms. 30 tablet 1   escitalopram (LEXAPRO) 20 MG tablet Take 1 tablet (20 mg total) by mouth daily. 90 tablet 3   famotidine (PEPCID) 20 MG tablet Take 20 mg by mouth daily.     levothyroxine (SYNTHROID) 125 MCG tablet Take 1 tablet (125 mcg total) by mouth daily before breakfast. TAKE 1 TABLET(112 MCG) BY MOUTH DAILY BEFORE BREAKFAST 90 tablet 0   LORazepam (ATIVAN) 0.5 MG tablet Take 0.5 mg by mouth daily as needed for anxiety.     ondansetron (ZOFRAN) 4 MG tablet Take 1 tablet (4 mg total) by mouth every 8 (eight) hours as needed for nausea or vomiting. 20 tablet 3   No current facility-administered medications for this  visit.   Review of Systems: All systems reviewed and negative except where noted in HPI.   Physical Exam: BP 122/64   Pulse 65   Ht 5\' 3"  (1.6 m)   Wt 135 lb 6 oz (61.4 kg)   BMI 23.98 kg/m  Constitutional: Pleasant,well-developed, female in no acute distress. HEENT: Normocephalic and atraumatic. Conjunctivae are normal. No scleral icterus. Cardiovascular: Normal rate, regular rhythm.  Pulmonary/chest: Effort normal and breath sounds normal.  Tender in the left ribs. Abdominal: Soft, nondistended, nontender in the abdomen. Bowel sounds intact Extremities: No edema Neurological: Alert and oriented to person place and time. Skin: Skin is warm and dry. No rashes noted. Psychiatric: Normal mood and affect. Behavior is normal.  Labs 06/2021: CBC and CMP unremarkable. Lipase nml.  Labs 08/2021: TSH nml  Labs 03/2023: CBC nml. CMP unremarkable. HIV neg. HCV antibody negative. TSH elevated at 31.84  Labs 04/2023: TSH nml. FT4 nml.   RUQ U/S 06/28/21: IMPRESSION: Massive enlargement of multiple hepatic cysts in comparison to prior ultrasound in 2006, measuring up to 13.5 cm in right hepatic lobe and 12.0 cm in left hepatic lobe, with complex appearance. This could potentially be a source of abdominal pain and can be further evaluated with nonemergent outpatient MRI with and without contrast. Normal appearance of the gallbladder.  MR Abd 07/18/21: IMPRESSION: Numerous bilobar hepatic  cysts measuring up to 13.7 cm. No suspicious hepatic lesion.  RUQ U/S 03/18/23: FINDINGS: Gallbladder: No gallstones or wall thickening visualized. No sonographic Murphy sign noted by sonographer. Mild amount of sludge is noted within gallbladder lumen. Common bile duct: Diameter: 4 mm which is within normal limits. Liver: Multiple hepatic cysts are noted, the largest measuring 3.1 cm in right hepatic lobe. Within normal limits in parenchymal echogenicity. Portal vein is patent on color Doppler  imaging with normal direction of blood flow towards the liver. IMPRESSION: Mild amount of sludge seen within gallbladder lumen. Multiple hepatic cysts as noted on prior studies.  EGD 10/10/21: - White nummular lesions in esophageal mucosa. Biopsied. - Erythematous mucosa in the antrum. Biopsied. - Normal examined duodenum. Biopsied. Path: 1. Surgical [P], 2nd portion of duodenum BENIGN DUODENAL MUCOSA WITH NO DIAGNOSTIC ABNORMALITY 2. Surgical [P], gastric antrum and gastric body REACTIVE GASTROPATHY AND MILD CHRONIC GASTRITIS WITH LYMPHOID AGGREGATE FOCAL INTESTINAL METAPLASIA HELICOBACTER STAIN NEGATIVE (IHC, ADEQUATE CONTROL) NEGATIVE FOR DYSPLASIA AND CARCINOMA 3. Surgical [P], mid esophagus and distal esophagus REFLUX ESOPHAGITIS (3 EOS/HIGH POWER FIELD)  Colonoscopy 10/10/21: - A few diverticula were found in the sigmoid colon. - Non-bleeding internal hemorrhoids were found during retroflexion.  ASSESSMENT AND PLAN:  RUQ ab pain Left rib pain Gastric intestinal metaplasia Liver cysts s/p fenestration in 01/2022 GERD Patient previously had right upper quadrant abdominal pain due to large liver cysts that were treated with laparoscopic fenestration with ablation in 01/2022.  Patient had significant improvement in her symptoms after the surgery but then had a recurrence in right upper quadrant abdominal pain about 2 to 3 months ago.  Ultrasound at that time showed that she had much smaller liver cysts with the largest being 3 cm.  She was also noted to have some gallbladder sludge. It is possible that cholelithiasis, MSK related pain, or adhesions were the source of her right upper quadrant pain 2 to 3 months ago.  Currently she has no residual pain at this time.  If the patient were to have recurrence in her abdominal pain a future, could consider repeat imaging and/or labs.  Patient also injured her left ribs after running into a door so I recommended she tried a lidocaine patch for  that pain.  Patient is also due for an EGD for gastric mapping of her previously noted gastric intestinal metaplasia.  Will go ahead and get her scheduled for that. -EGD [facility] for gastric mapping -Recommended lidocaine patch for left rib pain -If right upper quadrant abdominal pain recurs, could consider repeat CT with labs.  Could also consider referral to surgery for consideration of cholecystectomy  Dana Pont, MD  I spent 31 minutes of time, including in depth chart review, independent review of results as outlined above, communicating results with the patient directly, face-to-face time with the patient, coordinating care, and ordering studies and medications as appropriate, and documentation.

## 2023-06-04 DIAGNOSIS — F320 Major depressive disorder, single episode, mild: Secondary | ICD-10-CM | POA: Diagnosis not present

## 2023-06-04 DIAGNOSIS — F411 Generalized anxiety disorder: Secondary | ICD-10-CM | POA: Diagnosis not present

## 2023-06-09 ENCOUNTER — Telehealth: Payer: Self-pay | Admitting: Internal Medicine

## 2023-06-09 NOTE — Telephone Encounter (Signed)
Inbound call from patient, wishing to reschedule her procedure for tomorrow 10/8 and 2:30 PM. Patient states she has a family emergency, she was rescheduled for 10/14 at 8:30 AM.

## 2023-06-10 ENCOUNTER — Encounter: Payer: BC Managed Care – PPO | Admitting: Internal Medicine

## 2023-06-16 ENCOUNTER — Encounter: Payer: Self-pay | Admitting: Internal Medicine

## 2023-06-16 ENCOUNTER — Ambulatory Visit: Payer: BC Managed Care – PPO | Admitting: Internal Medicine

## 2023-06-16 VITALS — BP 145/70 | HR 53 | Temp 97.7°F | Resp 10 | Ht 63.0 in | Wt 135.0 lb

## 2023-06-16 DIAGNOSIS — K449 Diaphragmatic hernia without obstruction or gangrene: Secondary | ICD-10-CM

## 2023-06-16 DIAGNOSIS — K31A0 Gastric intestinal metaplasia, unspecified: Secondary | ICD-10-CM

## 2023-06-16 DIAGNOSIS — K2950 Unspecified chronic gastritis without bleeding: Secondary | ICD-10-CM | POA: Diagnosis not present

## 2023-06-16 HISTORY — PX: UPPER GASTROINTESTINAL ENDOSCOPY: SHX188

## 2023-06-16 MED ORDER — SODIUM CHLORIDE 0.9 % IV SOLN: 500.0000 mL | Freq: Once | INTRAVENOUS | Status: DC

## 2023-06-16 NOTE — Progress Notes (Signed)
Pt's states no medical or surgical changes since previsit or office visit. 

## 2023-06-16 NOTE — Op Note (Signed)
[facility] Patient Name: Dana Pace Procedure Date: 06/16/2023 8:37 AM MRN: [id number] Endoscopist: Madelyn Brunner Leland , , [id number] Age: 52 Referring MD:  Date of Birth: 04-16-1971 Gender: Female Account #: [id number] Procedure:                Upper GI endoscopy Indications:              Follow-up of intestinal metaplasia Medicines:                Monitored Anesthesia Care Procedure:                Pre-Anesthesia Assessment:                           - Prior to the procedure, a History and Physical                            was performed, and patient medications and                            allergies were reviewed. The patient's tolerance of                            previous anesthesia was also reviewed. The risks                            and benefits of the procedure and the sedation                            options and risks were discussed with the patient.                            All questions were answered, and informed consent                            was obtained. Prior Anticoagulants: The patient has                            taken no anticoagulant or antiplatelet agents. ASA                            Grade Assessment: II - A patient with mild systemic                            disease. After reviewing the risks and benefits,                            the patient was deemed in satisfactory condition to                            undergo the procedure.                           After obtaining informed consent, the endoscope was  passed under direct vision. Throughout the                            procedure, the patient's blood pressure, pulse, and                            oxygen saturations were monitored continuously. The                            Olympus Scope SN [id number] was introduced through the                            mouth, and advanced to the second part of duodenum.                            The  upper GI endoscopy was accomplished without                            difficulty. The patient tolerated the procedure                            well. Scope In: Scope Out: Findings:                 The examined esophagus was normal.                           A hiatal hernia was present.                           Localized mildly erythematous mucosa without                            bleeding was found in the gastric antrum.                           Biopsies were taken with a cold forceps on the                            greater curvature of the gastric body, on the                            lesser curvature of the gastric body, at the                            incisura, on the greater curvature of the gastric                            antrum and on the lesser curvature of the gastric                            antrum for histology.                           The examined duodenum was normal. Complications:  No immediate complications. Estimated Blood Loss:     Estimated blood loss was minimal. Impression:               - Normal esophagus.                           - Hiatal hernia.                           - Erythematous mucosa in the antrum.                           - Normal examined duodenum.                           - Biopsies were taken with a cold forceps for                            histology on the greater curvature of the gastric                            body, on the lesser curvature of the gastric body,                            at the incisura, on the greater curvature of the                            gastric antrum and on the lesser curvature of the                            gastric antrum. Recommendation:           - Discharge patient to home (with escort).                           - Await pathology results.                           - The findings and recommendations were discussed                            with the patient. Dr Particia Lather "[name]" [name],  06/16/2023 8:56:10 AM

## 2023-06-16 NOTE — Progress Notes (Signed)
Report to PACU, RN, vss, BBS= Clear.  

## 2023-06-16 NOTE — Patient Instructions (Addendum)
Be sure to take your pepcid 1/2 hour before your eat.  If you take it on an empty stomach, it will not work.  Resume all of your other medications today as ordered.  It will take about 2 weeks to get the biopsy results back.   YOU HAD AN ENDOSCOPIC PROCEDURE TODAY AT THE [facility]:   Refer to the procedure report that was given to you for any specific questions about what was found during the examination.  If the procedure report does not answer your questions, please call your gastroenterologist to clarify.  If you requested that your care partner not be given the details of your procedure findings, then the procedure report has been included in a sealed envelope for you to review at your convenience later.  YOU SHOULD EXPECT: Some feelings of bloating in the abdomen. Passage of more gas than usual.   Please Note:  You might notice some irritation and congestion in your nose or some drainage.  This is from the oxygen used during your procedure.  There is no need for concern and it should clear up in a day or so.  SYMPTOMS TO REPORT IMMEDIATELY:   Following upper endoscopy (EGD)  Vomiting of blood or coffee ground material  New chest pain or pain under the shoulder blades  Painful or persistently difficult swallowing  New shortness of breath  Fever of 100F or higher  Black, tarry-looking stools  For urgent or emergent issues, a gastroenterologist can be reached at any hour by calling [phone]. Do not use MyChart messaging for urgent concerns.    DIET:  We do recommend a small meal at first, but then you may proceed to your regular diet.  Drink plenty of fluids but you should avoid alcoholic beverages for 24 hours. Try to avoid acidic foods.  Read your handouts regarding the hiatal hernia.  ACTIVITY:  You should plan to take it easy for the rest of today and you should NOT DRIVE or use heavy machinery until tomorrow (because of the sedation medicines used during the  test).    FOLLOW UP: Our staff will call the number listed on your records the next business day following your procedure.  We will call around 7:15- 8:00 am to check on you and address any questions or concerns that you may have regarding the information given to you following your procedure. If we do not reach you, we will leave a message.     If any biopsies were taken you will be contacted by phone or by letter within the next 1-3 weeks.  Please call us at [phone] if you have not heard about the biopsies in 3 weeks.    SIGNATURES/CONFIDENTIALITY: You and/or your care partner have signed paperwork which will be entered into your electronic medical record.  These signatures attest to the fact that that the information above on your After Visit Summary has been reviewed and is understood.  Full responsibility of the confidentiality of this discharge information lies with you and/or your care-partner.

## 2023-06-16 NOTE — Progress Notes (Signed)
Called to room to assist during endoscopic procedure.  Patient ID and intended procedure confirmed with present staff. Received instructions for my participation in the procedure from the performing physician.  

## 2023-06-16 NOTE — Progress Notes (Signed)
GASTROENTEROLOGY PROCEDURE H&P NOTE   Primary Care Physician: Myrlene Broker, MD    Reason for Procedure:   Gastric intestinal metaplasia  Plan:    EGD  Patient is appropriate for endoscopic procedure(s) in the ambulatory ([facility]) setting.  The nature of the procedure, as well as the risks, benefits, and alternatives were carefully and thoroughly reviewed with the patient. Ample time for discussion and questions allowed. The patient understood, was satisfied, and agreed to proceed.     HPI: Dana Pace is a 52 y.o. female who presents for EGD for evaluation of gastric intestinal metaplasia.  Patient was most recently seen in the Gastroenterology Clinic on 06/03/23.  No interval change in medical history since that appointment. Please refer to that note for full details regarding GI history and clinical presentation.   Past Medical History:  Diagnosis Date   Anxiety    Blood transfusion without reported diagnosis    Hashimoto's disease    Headache    Hepatic cyst    Hx of migraine headaches    Hypertension    controlled   Hypothyroidism    PONV (postoperative nausea and vomiting)    trouble waking up after colonoscopy   Thyroid disease     Past Surgical History:  Procedure Laterality Date   ABDOMINAL HYSTERECTOMY     BREAST BIOPSY Right 07/09/2017   benign   COLONOSCOPY     DILATION AND CURETTAGE OF UTERUS     x2   LAPAROSCOPIC PARTIAL HEPATECTOMY N/A 02/28/2022   Procedure: LAPAROSCOPIC FENESTRATION OF HEPATIC CYSTS;  Surgeon: Fritzi Mandes, MD;  Location: [facility] OR;  Service: General;  Laterality: N/A;   WISDOM TOOTH EXTRACTION      Prior to Admission medications   Medication Sig Start Date End Date Taking? Authorizing Provider  escitalopram (LEXAPRO) 20 MG tablet Take 1 tablet (20 mg total) by mouth daily. 03/17/23  Yes Myrlene Broker, MD  famotidine (PEPCID) 20 MG tablet Take 20 mg by mouth daily.   Yes [provider]  levothyroxine  (SYNTHROID) 125 MCG tablet Take 1 tablet (125 mcg total) by mouth daily before breakfast. TAKE 1 TABLET(112 MCG) BY MOUTH DAILY BEFORE BREAKFAST 03/20/23  Yes Elenore Paddy, NP  cyclobenzaprine (FLEXERIL) 5 MG tablet Take 1 tablet (5 mg total) by mouth at bedtime as needed for muscle spasms. 08/04/18   Olive Bass, FNP  LORazepam (ATIVAN) 0.5 MG tablet Take 0.5 mg by mouth daily as needed for anxiety. Patient not taking: Reported on 06/16/2023 02/02/21   [provider]  ondansetron (ZOFRAN) 4 MG tablet Take 1 tablet (4 mg total) by mouth every 8 (eight) hours as needed for nausea or vomiting. 03/17/23   Myrlene Broker, MD    Current Outpatient Medications  Medication Sig Dispense Refill   escitalopram (LEXAPRO) 20 MG tablet Take 1 tablet (20 mg total) by mouth daily. 90 tablet 3   famotidine (PEPCID) 20 MG tablet Take 20 mg by mouth daily.     levothyroxine (SYNTHROID) 125 MCG tablet Take 1 tablet (125 mcg total) by mouth daily before breakfast. TAKE 1 TABLET(112 MCG) BY MOUTH DAILY BEFORE BREAKFAST 90 tablet 0   cyclobenzaprine (FLEXERIL) 5 MG tablet Take 1 tablet (5 mg total) by mouth at bedtime as needed for muscle spasms. 30 tablet 1   LORazepam (ATIVAN) 0.5 MG tablet Take 0.5 mg by mouth daily as needed for anxiety. (Patient not taking: Reported on 06/16/2023)     ondansetron [facility])  4 MG tablet Take 1 tablet (4 mg total) by mouth every 8 (eight) hours as needed for nausea or vomiting. 20 tablet 3   No current facility-administered medications for this visit.    Allergies as of 06/16/2023 - Review Complete 06/16/2023  Allergen Reaction Noted   Augmentin [amoxicillin-pot clavulanate] Diarrhea 08/04/2018   Prednisone Other (See Comments) 11/15/2019    Family History  Problem Relation Age of Onset   Anuerysm Mother    Mental illness Father    Thyroid disease Sister    Hyperlipidemia Sister    Other Sister        prediabetes   Mental illness Paternal Uncle     Alcohol abuse Paternal Uncle    Uterine cancer Maternal Grandmother    Hyperlipidemia Maternal Grandmother    Hypertension Maternal Grandmother    Diabetes Maternal Grandmother    Lung cancer Maternal Grandfather    Stomach cancer Paternal Grandmother        ?   Cancer Paternal Grandfather        origin unknown mets to brain   ADD / ADHD Daughter    Post-traumatic stress disorder Daughter        boderline personality   Anxiety disorder Daughter    Depression Daughter    Bipolar disorder Daughter    Autism Daughter    Depression Daughter    Breast cancer Neg Hx    Colon polyps Neg Hx    Colon cancer Neg Hx    Esophageal cancer Neg Hx    Rectal cancer Neg Hx     Social History   Socioeconomic History   Marital status: Married    Spouse name: Not on file   Number of children: Not on file   Years of education: Not on file   Highest education level: Associate degree: occupational, Scientist, product/process development, or vocational program  Occupational History   Not on file  Tobacco Use   Smoking status: Former   Smokeless tobacco: Never  Vaping Use   Vaping status: Every Day   Substances: Nicotine  Substance and Sexual Activity   Alcohol use: Yes    Alcohol/week: 2.0 standard drinks of alcohol    Types: 2 Standard drinks or equivalent per week    Comment: 2-3 drinks per month (02/25/22)   Drug use: Yes    Types: Marijuana    Comment: twice a year   Sexual activity: Not Currently  Other Topics Concern   Not on file  Social History Narrative   Not on file   Social Determinants of Health   Financial Resource Strain: Low Risk  (05/02/2023)   Overall Financial Resource Strain (CARDIA)    Difficulty of Paying Living Expenses: Not hard at all  Food Insecurity: No Food Insecurity (05/02/2023)   Hunger Vital Sign    Worried About Running Out of Food in the Last Year: Never true    Ran Out of Food in the Last Year: Never true  Transportation Needs: No Transportation Needs (05/02/2023)    PRAPARE - Administrator, Civil Service (Medical): No    Lack of Transportation (Non-Medical): No  Physical Activity: Unknown (05/02/2023)   Exercise Vital Sign    Days of Exercise per Week: 0 days    Minutes of Exercise per Session: Not on file  Stress: Stress Concern Present (05/02/2023)   [employer] of Occupational Health - Occupational Stress Questionnaire    Feeling of Stress : To some extent  Social Connections: Moderately Isolated (05/02/2023)  Social Advertising account executive [NHANES]    Frequency of Communication with Friends and Family: More than three times a week    Frequency of Social Gatherings with Friends and Family: Once a week    Attends Religious Services: Never    Database administrator or Organizations: No    Attends Engineer, structural: Not on file    Marital Status: Married  Catering manager Violence: Not on file    Physical Exam: Vital signs in last 24 hours: BP 132/84   Pulse 60   Temp 97.7 F (36.5 C) (Temporal)   Ht 5\' 3"  (1.6 m)   Wt 135 lb (61.2 kg)   SpO2 100%   BMI 23.91 kg/m  GEN: NAD EYE: Sclerae anicteric ENT: MMM CV: Non-tachycardic Pulm: No increased WOB GI: Soft NEURO:  Alert & Oriented   Eulah Pont, MD [facility]   06/16/2023 8:07 AM

## 2023-06-17 ENCOUNTER — Telehealth: Payer: Self-pay

## 2023-06-17 NOTE — Telephone Encounter (Signed)
  Follow up Call-     06/16/2023    7:57 AM 10/10/2021    2:13 PM  Call back number  Post procedure Call Back phone  # [phone]  Permission to leave phone message Yes Yes     Patient questions:  Do you have a fever, pain , or abdominal swelling? No. Pain Score  0 *  Have you tolerated food without any problems? Yes.    Have you been able to return to your normal activities? Yes.    Do you have any questions about your discharge instructions: Diet   No. Medications  No. Follow up visit  No.  Do you have questions or concerns about your Care? No.  Actions: * If pain score is 4 or above: No action needed, pain <4.

## 2023-06-18 ENCOUNTER — Encounter: Payer: Self-pay | Admitting: Internal Medicine

## 2023-06-18 LAB — SURGICAL PATHOLOGY

## 2023-06-19 DIAGNOSIS — F320 Major depressive disorder, single episode, mild: Secondary | ICD-10-CM | POA: Diagnosis not present

## 2023-06-19 DIAGNOSIS — F411 Generalized anxiety disorder: Secondary | ICD-10-CM | POA: Diagnosis not present

## 2023-06-25 DIAGNOSIS — F320 Major depressive disorder, single episode, mild: Secondary | ICD-10-CM | POA: Diagnosis not present

## 2023-06-25 DIAGNOSIS — F411 Generalized anxiety disorder: Secondary | ICD-10-CM | POA: Diagnosis not present

## 2023-06-26 ENCOUNTER — Other Ambulatory Visit: Payer: Self-pay | Admitting: Nurse Practitioner

## 2023-06-26 DIAGNOSIS — E039 Hypothyroidism, unspecified: Secondary | ICD-10-CM

## 2023-09-15 ENCOUNTER — Other Ambulatory Visit: Payer: Self-pay | Admitting: Internal Medicine

## 2023-09-15 DIAGNOSIS — Z1231 Encounter for screening mammogram for malignant neoplasm of breast: Secondary | ICD-10-CM

## 2023-09-17 ENCOUNTER — Other Ambulatory Visit: Payer: Self-pay | Admitting: Nurse Practitioner

## 2023-09-17 ENCOUNTER — Ambulatory Visit: Payer: Self-pay

## 2023-09-17 ENCOUNTER — Ambulatory Visit: Payer: BC Managed Care – PPO

## 2023-09-17 DIAGNOSIS — M79641 Pain in right hand: Secondary | ICD-10-CM

## 2023-09-17 DIAGNOSIS — M25531 Pain in right wrist: Secondary | ICD-10-CM

## 2023-09-17 DIAGNOSIS — M79631 Pain in right forearm: Secondary | ICD-10-CM

## 2023-09-25 ENCOUNTER — Ambulatory Visit
Admission: RE | Admit: 2023-09-25 | Discharge: 2023-09-25 | Disposition: A | Discharge status: Home / Self Care | Payer: Managed Care, Other (non HMO) | Source: Ambulatory Visit

## 2023-09-25 ENCOUNTER — Ambulatory Visit: Payer: BC Managed Care – PPO

## 2023-09-25 DIAGNOSIS — Z1231 Encounter for screening mammogram for malignant neoplasm of breast: Secondary | ICD-10-CM

## 2023-09-26 ENCOUNTER — Encounter: Payer: Self-pay | Admitting: Internal Medicine

## 2023-09-26 LAB — HM MAMMOGRAPHY

## 2023-10-24 IMAGING — MG MM DIGITAL DIAGNOSTIC UNILAT*R* W/ TOMO W/ CAD
4 series · 4 of 12 positions shown · non-contrast
Comparison: Prior films

CLINICAL DATA: Callback from screening mammogram for possible
asymmetry right breast

EXAM:
DIGITAL DIAGNOSTIC UNILATERAL RIGHT MAMMOGRAM WITH TOMOSYNTHESIS AND
CAD
TECHNIQUE: Right digital diagnostic mammography and breast tomosynthesis was
performed. The images were evaluated with computer-aided detection.

[R CC synth-2D]
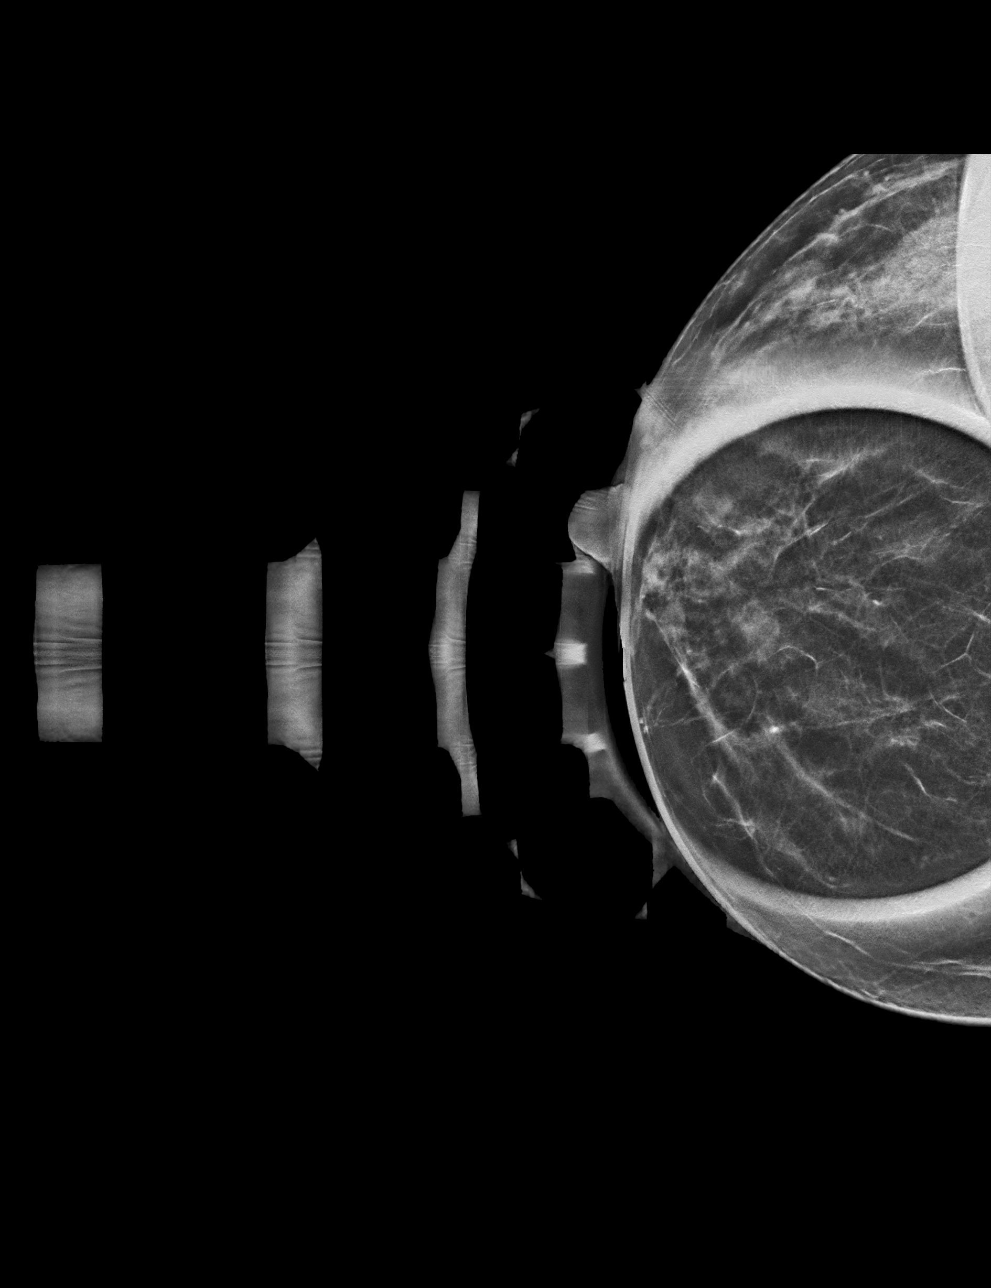

[R ML synth-2D]
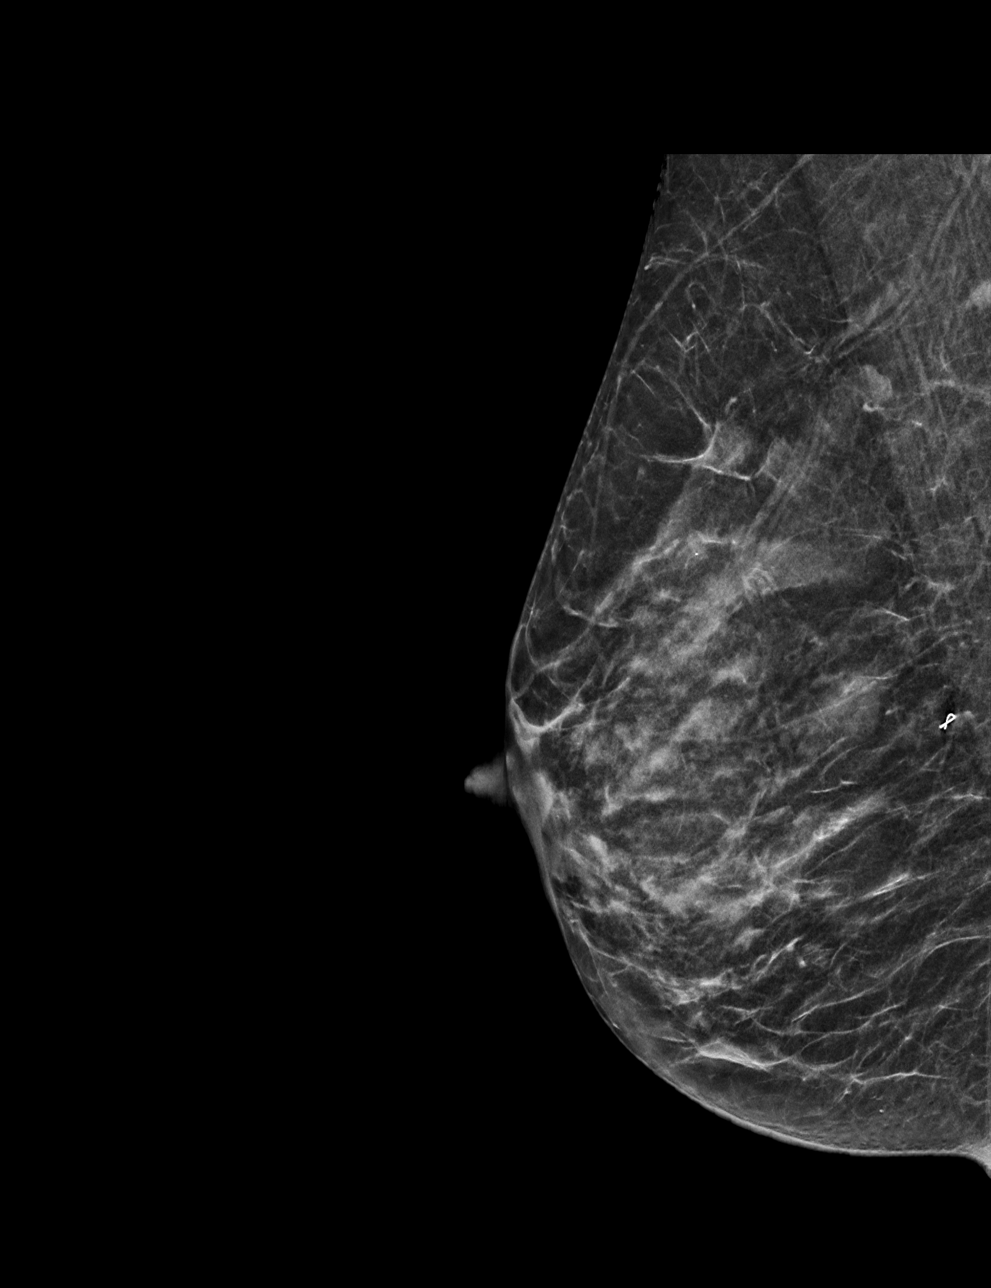

[R CC tomo · tomo slice 21/42.0]
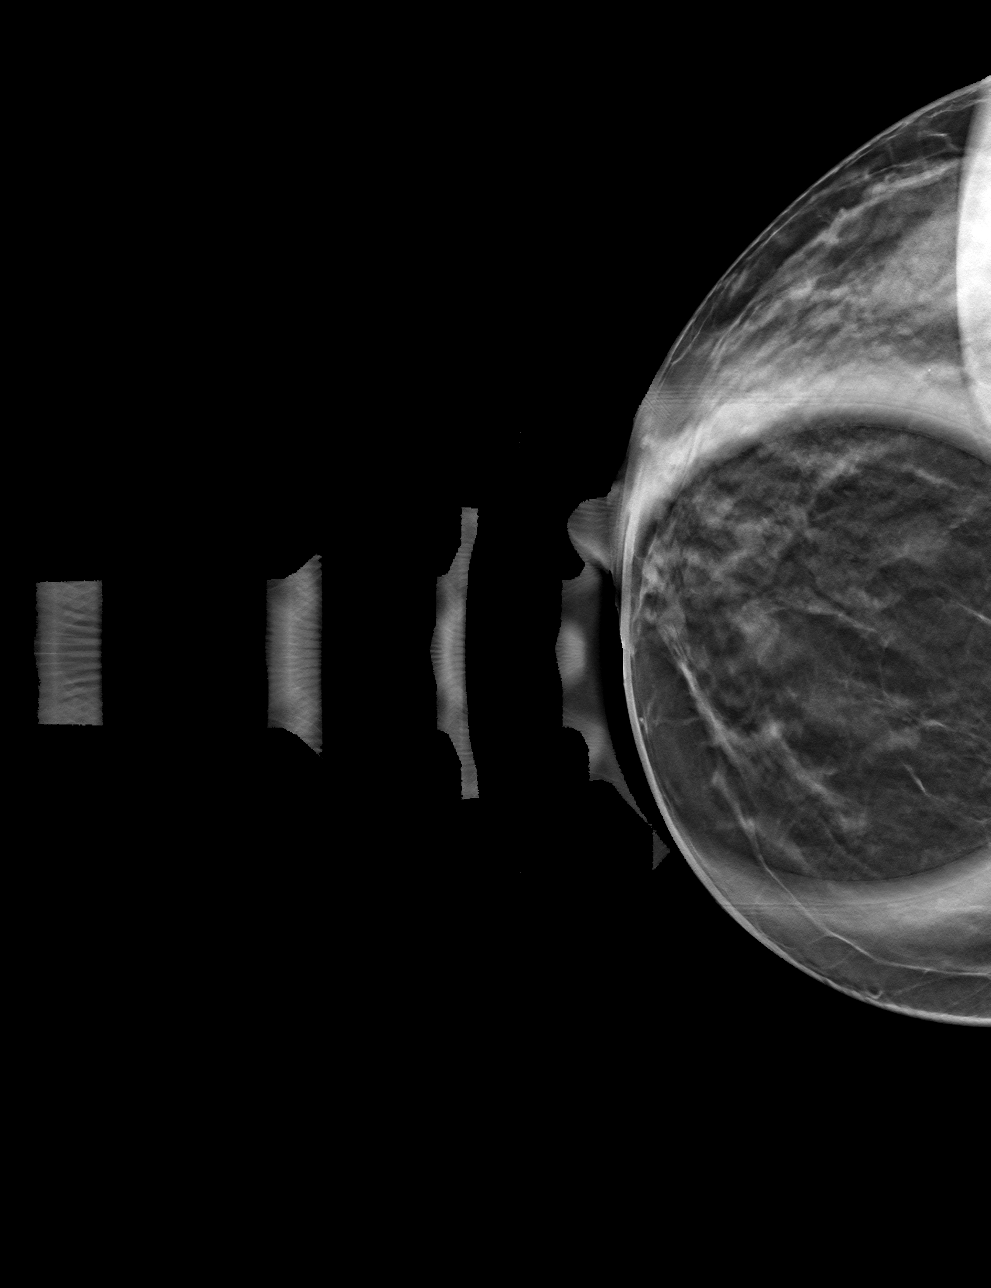

[R ML tomo · tomo slice 22/43.0]
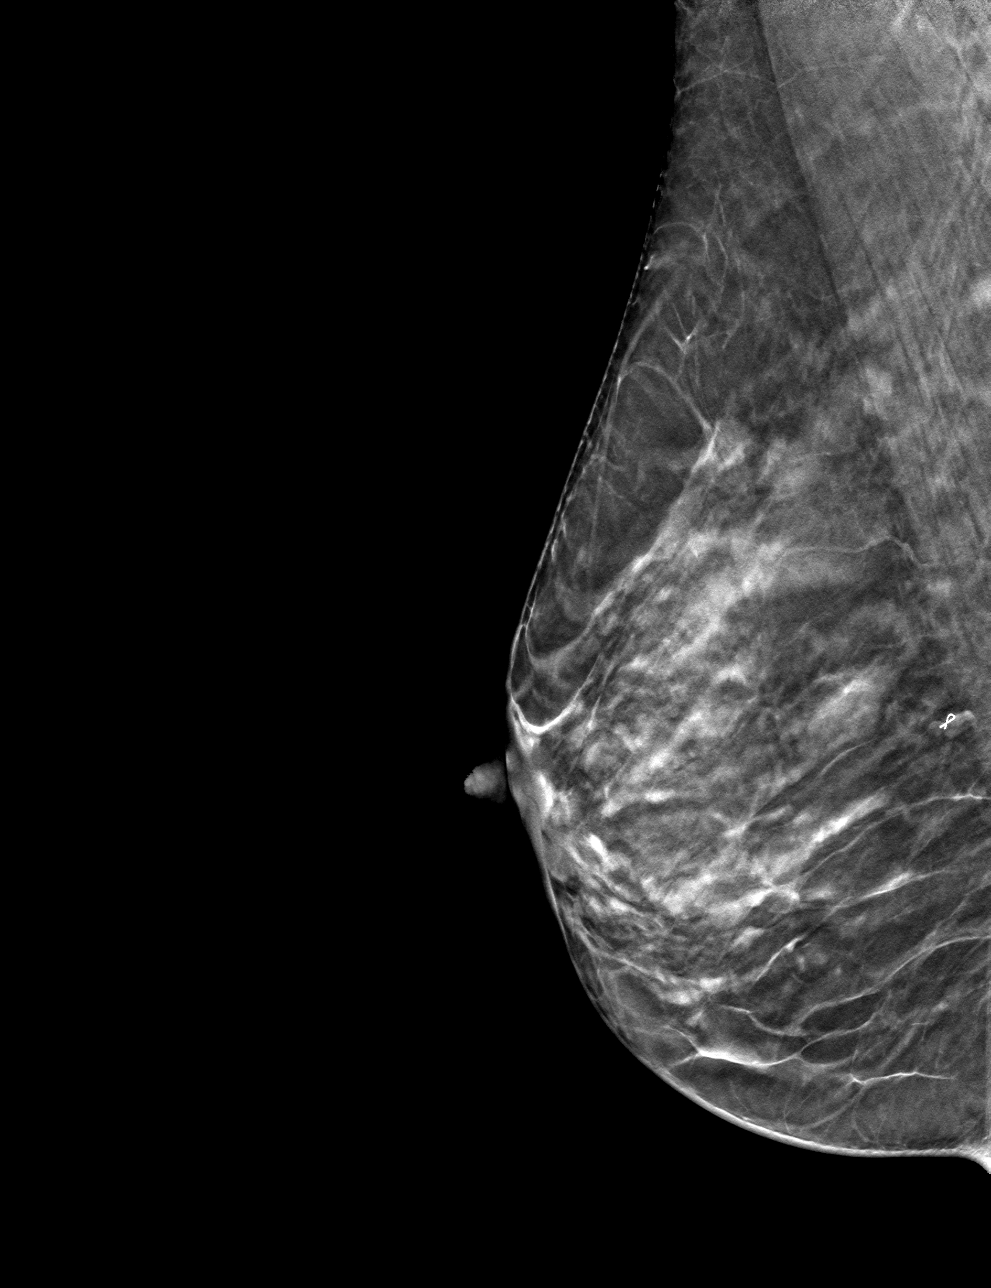

[4 of 12 positions shown; findings below may reference images not displayed]

ACR Breast Density Category c: The breast tissue is heterogeneously
dense, which may obscure small masses.
FINDINGS: Lateral view right breast, spot compression right cc view are
submitted. Questioned asymmetry is less prominent on additional
views. The additional views the breast is stable compared to prior
exam 3356.
IMPRESSION: Probable benign findings.

RECOMMENDATION:
Six-month follow-up mammogram right breast.

I have discussed the findings and recommendations with the patient.
If applicable, a reminder letter will be sent to the patient
regarding the next appointment.

BI-RADS CATEGORY  3: Probably benign.

## 2024-03-21 ENCOUNTER — Other Ambulatory Visit: Payer: Self-pay | Admitting: Internal Medicine

## 2024-03-30 ENCOUNTER — Ambulatory Visit (INDEPENDENT_AMBULATORY_CARE_PROVIDER_SITE_OTHER): Payer: Self-pay | Admitting: Internal Medicine

## 2024-03-30 VITALS — BP 124/80 | HR 59 | Temp 98.5°F | Ht 63.0 in | Wt 135.0 lb

## 2024-03-30 DIAGNOSIS — K7689 Other specified diseases of liver: Secondary | ICD-10-CM

## 2024-03-30 DIAGNOSIS — R1011 Right upper quadrant pain: Secondary | ICD-10-CM

## 2024-03-30 DIAGNOSIS — Z0000 Encounter for general adult medical examination without abnormal findings: Secondary | ICD-10-CM

## 2024-03-30 DIAGNOSIS — E782 Mixed hyperlipidemia: Secondary | ICD-10-CM | POA: Diagnosis not present

## 2024-03-30 DIAGNOSIS — E039 Hypothyroidism, unspecified: Secondary | ICD-10-CM | POA: Diagnosis not present

## 2024-03-30 DIAGNOSIS — F4323 Adjustment disorder with mixed anxiety and depressed mood: Secondary | ICD-10-CM

## 2024-03-30 LAB — CBC
HCT: 37.1 % (ref 36.0–46.0)
Hemoglobin: 12.6 g/dL (ref 12.0–15.0)
MCHC: 34 g/dL (ref 30.0–36.0)
MCV: 85.7 fl (ref 78.0–100.0)
Platelets: 194 K/uL (ref 150.0–400.0)
RBC: 4.33 Mil/uL (ref 3.87–5.11)
RDW: 12.9 % (ref 11.5–15.5)
WBC: 4.9 K/uL (ref 4.0–10.5)

## 2024-03-30 LAB — COMPREHENSIVE METABOLIC PANEL WITH GFR
ALT: 16 U/L (ref 0–35)
AST: 18 U/L (ref 0–37)
Albumin: 4.3 g/dL (ref 3.5–5.2)
Alkaline Phosphatase: 74 U/L (ref 39–117)
BUN: 11 mg/dL (ref 6–23)
CO2: 31 meq/L (ref 19–32)
Calcium: 9.1 mg/dL (ref 8.4–10.5)
Chloride: 103 meq/L (ref 96–112)
Creatinine, Ser: 0.78 mg/dL (ref 0.40–1.20)
GFR: 87.99 mL/min (ref >60.00)
Glucose, Bld: 90 mg/dL (ref 70–99)
Potassium: 4 meq/L (ref 3.5–5.1)
Sodium: 141 meq/L (ref 135–145)
Total Bilirubin: 0.5 mg/dL (ref 0.2–1.2)
Total Protein: 7.1 g/dL (ref 6.0–8.3)

## 2024-03-30 LAB — TSH: TSH: 0.68 u[IU]/mL (ref 0.35–5.50)

## 2024-03-30 LAB — T4, FREE: Free T4: 1.03 ng/dL (ref 0.60–1.60)

## 2024-03-30 LAB — LIPID PANEL
Cholesterol: 189 mg/dL (ref 0–200)
HDL: 40 mg/dL (ref >39.00)
LDL Cholesterol: 113 mg/dL — ABNORMAL HIGH (ref 0–99)
NonHDL: 149.42
Total CHOL/HDL Ratio: 5
Triglycerides: 183 mg/dL — ABNORMAL HIGH (ref 0.0–149.0)
VLDL: 36.6 mg/dL (ref 0.0–40.0)

## 2024-03-30 MED ORDER — ONDANSETRON HCL 4 MG PO TABS: 4.0000 mg | ORAL_TABLET | Freq: Three times a day (TID) | ORAL | 3 refills | Status: AC | PRN

## 2024-03-30 MED ORDER — LEVOTHYROXINE SODIUM 125 MCG PO TABS
125.0000 ug | ORAL_TABLET | Freq: Every day | ORAL | 3 refills | Status: AC
Start: 2024-03-30 — End: ?

## 2024-03-30 MED ORDER — ESCITALOPRAM OXALATE 20 MG PO TABS: 20.0000 mg | ORAL_TABLET | Freq: Every day | ORAL | 3 refills | Status: AC

## 2024-03-30 NOTE — Assessment & Plan Note (Signed)
 Checking lipid panel and adjust as needed.

## 2024-03-30 NOTE — Assessment & Plan Note (Signed)
 Overall stable, any change in pain or bloating and repeat imaging would be warranted.

## 2024-03-30 NOTE — Assessment & Plan Note (Signed)
 Still happening and we discussed possible etiologies including hiatal hernia, scar tissue, gallbladder. Recommend to monitor for now and work on gerd diet.

## 2024-03-30 NOTE — Progress Notes (Signed)
   Subjective:   Patient ID: Dana Pace, female    DOB: 1970/09/17, 53 y.o.   MRN: [id number]  HPI The patient is here for physical.  PMH, [facility], social history reviewed and updated  Review of Systems  Constitutional: Negative.   HENT: Negative.    Eyes: Negative.   Respiratory:  Negative for cough, chest tightness and shortness of breath.   Cardiovascular:  Negative for chest pain, palpitations and leg swelling.  Gastrointestinal:  Positive for abdominal pain. Negative for abdominal distention, constipation, diarrhea, nausea and vomiting.  Musculoskeletal: Negative.   Skin: Negative.   Neurological: Negative.   Psychiatric/Behavioral: Negative.      Objective:  Physical Exam Constitutional:      Appearance: She is well-developed.  HENT:     Head: Normocephalic and atraumatic.  Cardiovascular:     Rate and Rhythm: Normal rate and regular rhythm.  Pulmonary:     Effort: Pulmonary effort is normal. No respiratory distress.     Breath sounds: Normal breath sounds. No wheezing or rales.  Abdominal:     General: Bowel sounds are normal. There is no distension.     Palpations: Abdomen is soft.     Tenderness: There is no abdominal tenderness. There is no rebound.  Musculoskeletal:     Cervical back: Normal range of motion.  Skin:    General: Skin is warm and dry.  Neurological:     Mental Status: She is alert and oriented to person, place, and time.     Coordination: Coordination normal.     Vitals:   03/30/24 1455  BP: 124/80  Pulse: (!) 59  Temp: 98.5 F (36.9 C)  TempSrc: Oral  SpO2: 99%  Weight: 135 lb (61.2 kg)  Height: 5' 3 (1.6 m)    Assessment & Plan:

## 2024-03-30 NOTE — Assessment & Plan Note (Signed)
 Taking lexapro  20 mg daily and controlled. Continue and refilled.

## 2024-03-30 NOTE — Assessment & Plan Note (Signed)
 Has not been taking regularly recently so checking TSH and free T4 and if abnormal will recheck in a few months. She is now taking synthroid  125 mcg daily regularly again.

## 2024-03-30 NOTE — Assessment & Plan Note (Signed)
Flu shot yearly. Shingrix complete. Tetanus up to date. Colonoscopy up to date. Mammogram up to date, pap smear up to date. Counseled about sun safety and mole surveillance. Counseled about the dangers of distracted driving. Given 10 year screening recommendations.

## 2024-03-31 ENCOUNTER — Ambulatory Visit: Payer: Self-pay | Admitting: Internal Medicine

## 2024-04-07 ENCOUNTER — Encounter: Payer: Self-pay | Admitting: Internal Medicine

## 2024-04-07 NOTE — Telephone Encounter (Signed)
**Note De-identified  [place] Obfuscation** Please advise 

## 2024-06-12 ENCOUNTER — Encounter: Payer: Self-pay | Admitting: Internal Medicine

## 2024-07-16 ENCOUNTER — Other Ambulatory Visit: Payer: Self-pay | Admitting: Internal Medicine

## 2024-07-16 ENCOUNTER — Encounter: Payer: Self-pay | Admitting: Internal Medicine

## 2024-07-16 ENCOUNTER — Ambulatory Visit: Admitting: Internal Medicine

## 2024-07-16 VITALS — BP 130/80 | HR 59 | Temp 98.3°F | Ht 63.0 in | Wt 138.0 lb

## 2024-07-16 DIAGNOSIS — N644 Mastodynia: Secondary | ICD-10-CM | POA: Diagnosis not present

## 2024-07-16 NOTE — Progress Notes (Signed)
   Subjective:   Patient ID: Dana Pace, female    DOB: Oct 26, 1970, 53 y.o.   MRN: [id number]  Discussed the use of AI scribe software for clinical note transcription with the patient, who gave verbal consent to proceed.  History of Present Illness Dana Pace is a 53 year old female who presents with a persistent skin lesion and associated pain.  A couple of months ago, she noticed a skin lesion, initially attributing it to a hot shower. The lesion has not significantly changed in size, though it may have branched out slightly. It has not doubled or tripled in size.  Recently, she has experienced pain associated with the lesion, described as discomfort radiating down the side and into the armpit, making it uncomfortable to lay on that side. She also notes occasional itchiness, but it is not severe or constant. The lesion has a purplish tone, and she is unsure if it is swollen or puffy. There is no drainage from the lesion.  A mammogram conducted in January was clear, and she has not noticed any lumps or bumps under the skin.  Review of Systems  Constitutional: Negative.   HENT: Negative.    Eyes: Negative.   Respiratory:  Negative for cough, chest tightness and shortness of breath.   Cardiovascular:  Negative for chest pain, palpitations and leg swelling.  Gastrointestinal:  Negative for abdominal distention, abdominal pain, constipation, diarrhea, nausea and vomiting.  Genitourinary:        Breast pain  Musculoskeletal: Negative.   Skin: Negative.   Neurological: Negative.   Psychiatric/Behavioral: Negative.      Objective:  Physical Exam Constitutional:      Appearance: She is well-developed.  HENT:     Head: Normocephalic and atraumatic.  Cardiovascular:     Rate and Rhythm: Normal rate and regular rhythm.  Pulmonary:     Effort: Pulmonary effort is normal. No respiratory distress.     Breath sounds: Normal breath sounds. No wheezing or rales.     Comments:  Breast exam normal without mass or LAD with tenderness left and right breast, on left breast 9 o clock some purplish hue to skin without thickness or nodularity.  Abdominal:     General: Bowel sounds are normal. There is no distension.     Palpations: Abdomen is soft.     Tenderness: There is no abdominal tenderness.  Musculoskeletal:     Cervical back: Normal range of motion.  Skin:    General: Skin is warm and dry.  Neurological:     Mental Status: She is alert and oriented to person, place, and time.     Coordination: Coordination normal.     Vitals:   07/16/24 0831  BP: 130/80  Pulse: (!) 59  Temp: 98.3 F (36.8 C)  TempSrc: Oral  SpO2: 99%  Weight: 138 lb (62.6 kg)  Height: 5' 3 (1.6 m)    Assessment and Plan Assessment & Plan Breast pain and skin discoloration   Purplish skin discoloration and tenderness are present without palpable lumps. A recent mammogram was clear. Differential diagnosis includes vascular changes. Malignancy or abscess is not suspected. Ordered bilateral mammogram to assess changes causing tenderness or discoloration.

## 2024-07-16 NOTE — Patient Instructions (Addendum)
 We will check the mammogram to be certain e

## 2024-07-20 ENCOUNTER — Inpatient Hospital Stay: Admission: RE | Admit: 2024-07-20 | Discharge: 2024-07-20 | Attending: Internal Medicine | Admitting: Internal Medicine

## 2024-07-20 ENCOUNTER — Ambulatory Visit
Admission: RE | Admit: 2024-07-20 | Discharge: 2024-07-20 | Disposition: A | Discharge status: Home / Self Care | Source: Ambulatory Visit | Attending: Internal Medicine | Admitting: Internal Medicine

## 2024-07-20 DIAGNOSIS — N644 Mastodynia: Secondary | ICD-10-CM

## 2024-08-03 ENCOUNTER — Other Ambulatory Visit

## 2024-08-03 ENCOUNTER — Encounter
